# Patient Record
Sex: Female | Born: 1935 | Race: Black or African American | Hispanic: No | Smoking: Never smoker
Health system: Southern US, Community
[De-identification: ages and names within clinical notes are randomized; demographics above are authoritative.]

## PROBLEM LIST (undated history)

## (undated) DIAGNOSIS — I10 Essential (primary) hypertension: Secondary | ICD-10-CM

## (undated) DIAGNOSIS — J302 Other seasonal allergic rhinitis: Secondary | ICD-10-CM

## (undated) DIAGNOSIS — E785 Hyperlipidemia, unspecified: Secondary | ICD-10-CM

## (undated) DIAGNOSIS — E538 Deficiency of other specified B group vitamins: Secondary | ICD-10-CM

## (undated) DIAGNOSIS — H409 Unspecified glaucoma: Secondary | ICD-10-CM

## (undated) DIAGNOSIS — Z950 Presence of cardiac pacemaker: Secondary | ICD-10-CM

## (undated) DIAGNOSIS — R42 Dizziness and giddiness: Secondary | ICD-10-CM

## (undated) DIAGNOSIS — E039 Hypothyroidism, unspecified: Secondary | ICD-10-CM

## (undated) DIAGNOSIS — G629 Polyneuropathy, unspecified: Secondary | ICD-10-CM

## (undated) DIAGNOSIS — N189 Chronic kidney disease, unspecified: Secondary | ICD-10-CM

## (undated) HISTORY — DX: Deficiency of other specified B group vitamins: E53.8

## (undated) HISTORY — PX: ABDOMINAL HYSTERECTOMY: SHX81

## (undated) HISTORY — DX: Essential (primary) hypertension: I10

## (undated) HISTORY — DX: Hyperlipidemia, unspecified: E78.5

## (undated) HISTORY — DX: Other seasonal allergic rhinitis: J30.2

## (undated) HISTORY — DX: Chronic kidney disease, unspecified: N18.9

## (undated) HISTORY — PX: DG GALL BLADDER: HXRAD326

## (undated) HISTORY — DX: Hypothyroidism, unspecified: E03.9

## (undated) HISTORY — DX: Polyneuropathy, unspecified: G62.9

---

## 2015-03-28 DIAGNOSIS — E538 Deficiency of other specified B group vitamins: Secondary | ICD-10-CM | POA: Insufficient documentation

## 2015-03-28 DIAGNOSIS — I495 Sick sinus syndrome: Secondary | ICD-10-CM | POA: Diagnosis present

## 2015-03-28 DIAGNOSIS — E039 Hypothyroidism, unspecified: Secondary | ICD-10-CM | POA: Diagnosis present

## 2015-03-28 DIAGNOSIS — K219 Gastro-esophageal reflux disease without esophagitis: Secondary | ICD-10-CM | POA: Diagnosis present

## 2015-03-28 DIAGNOSIS — G629 Polyneuropathy, unspecified: Secondary | ICD-10-CM

## 2015-03-28 DIAGNOSIS — Z950 Presence of cardiac pacemaker: Secondary | ICD-10-CM | POA: Diagnosis present

## 2015-05-23 DIAGNOSIS — D649 Anemia, unspecified: Secondary | ICD-10-CM | POA: Diagnosis present

## 2016-04-03 DIAGNOSIS — I10 Essential (primary) hypertension: Secondary | ICD-10-CM | POA: Diagnosis present

## 2016-10-22 DIAGNOSIS — E7800 Pure hypercholesterolemia, unspecified: Secondary | ICD-10-CM | POA: Diagnosis present

## 2017-07-23 DIAGNOSIS — D563 Thalassemia minor: Secondary | ICD-10-CM | POA: Insufficient documentation

## 2018-02-01 DIAGNOSIS — E559 Vitamin D deficiency, unspecified: Secondary | ICD-10-CM | POA: Insufficient documentation

## 2018-11-30 DIAGNOSIS — R928 Other abnormal and inconclusive findings on diagnostic imaging of breast: Secondary | ICD-10-CM | POA: Insufficient documentation

## 2019-08-11 DIAGNOSIS — N1832 Chronic kidney disease, stage 3b: Secondary | ICD-10-CM | POA: Insufficient documentation

## 2019-08-11 DIAGNOSIS — Z9181 History of falling: Secondary | ICD-10-CM | POA: Insufficient documentation

## 2020-04-30 ENCOUNTER — Other Ambulatory Visit: Payer: Self-pay

## 2020-04-30 ENCOUNTER — Encounter (HOSPITAL_COMMUNITY): Payer: Self-pay

## 2020-04-30 ENCOUNTER — Ambulatory Visit (HOSPITAL_COMMUNITY)
Admission: EM | Admit: 2020-04-30 | Discharge: 2020-04-30 | Disposition: A | Discharge status: Home / Self Care | Payer: Self-pay | Attending: Emergency Medicine | Admitting: Emergency Medicine

## 2020-04-30 DIAGNOSIS — R55 Syncope and collapse: Secondary | ICD-10-CM | POA: Insufficient documentation

## 2020-04-30 DIAGNOSIS — R42 Dizziness and giddiness: Secondary | ICD-10-CM | POA: Insufficient documentation

## 2020-04-30 HISTORY — DX: Presence of cardiac pacemaker: Z95.0

## 2020-04-30 HISTORY — DX: Unspecified glaucoma: H40.9

## 2020-04-30 HISTORY — DX: Dizziness and giddiness: R42

## 2020-04-30 LAB — CBC WITH DIFFERENTIAL/PLATELET
Abs Immature Granulocytes: 0.04 10*3/uL (ref 0.00–0.07)
Basophils Absolute: 0 10*3/uL (ref 0.0–0.1)
Basophils Relative: 1 %
Eosinophils Absolute: 0.2 10*3/uL (ref 0.0–0.5)
Eosinophils Relative: 3 %
HCT: 33.8 % — ABNORMAL LOW (ref 36.0–46.0)
Hemoglobin: 10.6 g/dL — ABNORMAL LOW (ref 12.0–15.0)
Immature Granulocytes: 1 %
Lymphocytes Relative: 21 %
Lymphs Abs: 1.3 10*3/uL (ref 0.7–4.0)
MCH: 22 pg — ABNORMAL LOW (ref 26.0–34.0)
MCHC: 31.4 g/dL (ref 30.0–36.0)
MCV: 70.1 fL — ABNORMAL LOW (ref 80.0–100.0)
Monocytes Absolute: 0.6 10*3/uL (ref 0.1–1.0)
Monocytes Relative: 10 %
Neutro Abs: 4.1 10*3/uL (ref 1.7–7.7)
Neutrophils Relative %: 64 %
Platelets: 167 10*3/uL (ref 150–400)
RBC: 4.82 MIL/uL (ref 3.87–5.11)
RDW: 16.2 % — ABNORMAL HIGH (ref 11.5–15.5)
WBC: 6.2 10*3/uL (ref 4.0–10.5)
nRBC: 0 % (ref 0.0–0.2)

## 2020-04-30 LAB — POCT URINALYSIS DIPSTICK, ED / UC
Bilirubin Urine: NEGATIVE
Glucose, UA: NEGATIVE mg/dL
Hgb urine dipstick: NEGATIVE
Ketones, ur: NEGATIVE mg/dL
Leukocytes,Ua: NEGATIVE
Nitrite: NEGATIVE
Protein, ur: NEGATIVE mg/dL
Specific Gravity, Urine: 1.01 (ref 1.005–1.030)
Urobilinogen, UA: 0.2 mg/dL (ref 0.0–1.0)
pH: 6 (ref 5.0–8.0)

## 2020-04-30 LAB — COMPREHENSIVE METABOLIC PANEL
ALT: 15 U/L (ref 0–44)
AST: 25 U/L (ref 15–41)
Albumin: 3.5 g/dL (ref 3.5–5.0)
Alkaline Phosphatase: 51 U/L (ref 38–126)
Anion gap: 10 (ref 5–15)
BUN: 19 mg/dL (ref 8–23)
CO2: 25 mmol/L (ref 22–32)
Calcium: 9.1 mg/dL (ref 8.9–10.3)
Chloride: 102 mmol/L (ref 98–111)
Creatinine, Ser: 1.48 mg/dL — ABNORMAL HIGH (ref 0.44–1.00)
GFR, Estimated: 34 mL/min — ABNORMAL LOW (ref >60)
Glucose, Bld: 104 mg/dL — ABNORMAL HIGH (ref 70–99)
Potassium: 4.3 mmol/L (ref 3.5–5.1)
Sodium: 137 mmol/L (ref 135–145)
Total Bilirubin: 0.5 mg/dL (ref 0.3–1.2)
Total Protein: 6.2 g/dL — ABNORMAL LOW (ref 6.5–8.1)

## 2020-04-30 LAB — CBG MONITORING, ED: Glucose-Capillary: 103 mg/dL — ABNORMAL HIGH (ref 70–99)

## 2020-04-30 NOTE — ED Notes (Signed)
Second cup of water given to patient

## 2020-04-30 NOTE — ED Triage Notes (Signed)
Pt in with c/o near syncope event that occurred this morning while she was standing.  Pt states she started to feel dizzy and almost fainted but her son caught her. Also states she was confused and "foggy headed" for about 2 minutes after the event  Pt states she recently changed BP medications

## 2020-04-30 NOTE — ED Provider Notes (Signed)
[facility]    CSN: [id number] Arrival date & time: 04/30/20  1219      History   Chief Complaint Chief Complaint  Patient presents with  . Near Syncope    HPI Kristine Sherman is a 87 y.o. female.   Kristine Sherman presents with complaints of episode of near syncope this morning, hours before arrival. She was standing preparing her oatmeal when she felt very dizzy and unsteady. Her son was near and held her and lowered her to the ground. He states she closed her eyes but was always able to verbalize responses, and she states she was aware the entire time what was going, denying that she ever completely lost consciousness. After briefly laying down to recover he aided her in standing back up to get to a chair. She ate and drank. She still felt dizzy so presented to urgent care. She feels dizzy when she is up but feels fair with sitting. States her vision was blurred but is improving. No headache. No chest pain. No weakness. Her son states that her speech sounded different with episode but now sounds normal. Yesterday (Easter), she slept most of the day, which is unusual for her, she slept through brunch even. Last week was treated with colace for constipation. Denies any diarrhea. No vomiting. States she has not been drinking adequate water intake and has noted concentrated urine. Her blood pressure medication was changed from amlodipine to coreg, two weeks ago. She has a pacemaker.    States she stakes vitamins (d, b and multi), gabapentin, aspirin, diltiazem and three eye drops. She is not on any blood thinner.   ROS per HPI, negative if not otherwise mentioned.      Past Medical History:  Diagnosis Date  . Glaucoma   . Pacemaker   . Vertigo     There are no problems to display for this patient.   History reviewed. No pertinent surgical history.  OB History   No obstetric history on file.      Home Medications    Prior to Admission medications   Not on File     Family History History reviewed. No pertinent family history.  Social History Social History   Tobacco Use  . Smoking status: Never Smoker  . Smokeless tobacco: Never Used  Substance Use Topics  . Drug use: Never     Allergies   Other and Sulfa antibiotics   Review of Systems Review of Systems   Physical Exam Triage Vital Signs ED Triage Vitals  Enc Vitals Group     BP 04/30/20 1225 130/78     Pulse Rate 04/30/20 1225 67     Resp 04/30/20 1225 17     Temp --      Temp src --      SpO2 04/30/20 1225 98 %     Weight --      Height --      Head Circumference --      Peak Flow --      Pain Score 04/30/20 1228 0     Pain Loc --      Pain Edu? --      Excl. in GC? --    Orthostatic VS for the past 24 hrs:  BP- Lying Pulse- Lying BP- Sitting Pulse- Sitting BP- Standing at 0 minutes Pulse- Standing at 0 minutes  04/30/20 1332 137/86 75 138/87 71 (!) 124/92 86    Updated Vital Signs BP 123/85   Pulse 82  Resp 16   SpO2 98%   Visual Acuity Right Eye Distance:   Left Eye Distance:   Bilateral Distance:    Right Eye Near:   Left Eye Near:    Bilateral Near:     Physical Exam Constitutional:      General: She is not in acute distress.    Appearance: She is well-developed. She is not ill-appearing.  HENT:     Head: Normocephalic and atraumatic.     Mouth/Throat:     Mouth: Mucous membranes are moist.  Eyes:     Extraocular Movements: Extraocular movements intact.     Conjunctiva/sclera: Conjunctivae normal.     Pupils: Pupils are equal, round, and reactive to light.  Cardiovascular:     Rate and Rhythm: Normal rate and regular rhythm.  Pulmonary:     Effort: Pulmonary effort is normal.     Breath sounds: Normal breath sounds.  Musculoskeletal:        General: Normal range of motion.     Comments: Ambulatory in hallway and to restroom without difficulty   Skin:    General: Skin is warm and dry.  Neurological:     General: No focal deficit  present.     Mental Status: She is alert and oriented to person, place, and time.     Cranial Nerves: No cranial nerve deficit.     Sensory: No sensory deficit.     Motor: No weakness.     Coordination: Coordination normal.     Gait: Gait normal.  Psychiatric:        Mood and Affect: Mood normal.    ekg- paced rhythm without any obvious acute changes. No previous ekgs to compare.   UC Treatments / Results  Labs (all labs ordered are listed, but only abnormal results are displayed) Labs Reviewed  CBC WITH DIFFERENTIAL/PLATELET - Abnormal; Notable for the following components:      Result Value   Hemoglobin 10.6 (*)    HCT 33.8 (*)    MCV 70.1 (*)    MCH 22.0 (*)    RDW 16.2 (*)    All other components within normal limits  COMPREHENSIVE METABOLIC PANEL - Abnormal; Notable for the following components:   Glucose, Bld 104 (*)    Creatinine, Ser 1.48 (*)    Total Protein 6.2 (*)    GFR, Estimated 34 (*)    All other components within normal limits  CBG MONITORING, ED - Abnormal; Notable for the following components:   Glucose-Capillary 103 (*)    All other components within normal limits  POCT URINALYSIS DIPSTICK, ED / UC    EKG   Radiology No results found.  Procedures Procedures (including critical care time)  Medications Ordered in UC Medications - No data to display  Initial Impression / Assessment and Plan / UC Course  I have reviewed the triage vital signs and the nursing notes.  Pertinent labs & imaging results that were available during my care of the patient were reviewed by me and considered in my medical decision making (see chart for details).     1446- patient and son at bedside endorse continuing to generally feel improved; ambulated to restroom unassisted and feels fine. Drank 2 cups of water, she feels this has helped   Patient is alert, no weakness, confusion, slurred speech, chest pain, shortness of breath, persistent dizziness, urinary  symptoms, gi symptoms or uri symptoms. No headache. ekg without acute findings. Anemia- she states she has thalassemia and  feels this is likely normal for her. Creatinine 1.48- she has been told she has "borderline" kidney disease, suspect this is near baseline as well. Recently changed BP medication, now on diltiazem. Noted mild orthostatic hypostension here in clinic, suspect this may have contributed to episode this morning. She feels improved having increased her fluid intake while here. Cva/tia as well as acs considered and discussed with patient and her son. She will be staying with her son, they are agreeable to strict at home ER precautions. Agreeable to establishing with a PCP locally for chronic disease and medication management. Patient and son verbalized understanding and agreeable to plan.  Ambulatory out of clinic without difficulty.    Final Clinical Impressions(s) / UC Diagnoses   Final diagnoses:  Near syncope  Dizziness     Discharge Instructions     Your ekg is overall unremarkable which is reassuring.  Your vital signs looks fair, your blood pressure did drop some when you stood up, which can certainly cause dizziness, and can be related to dehydration.  Your urine looks well.  You are mildly anemic. I am not able to say if this is new for you or not, if it is new it certainly can cause some dizziness.  Increase your water intake.  If symptoms persist without any improvement please go to the ER. If nay worsening please go to the ER.  Please establish with a primary care provider for recheck of your symptoms in the next two weeks.     ED Prescriptions    None     PDMP not reviewed this encounter.   Georgetta Haber, NP 04/30/20 1606

## 2020-04-30 NOTE — ED Notes (Signed)
Pt is unable to urinate x 2, provider notified

## 2020-04-30 NOTE — Discharge Instructions (Signed)
Your ekg is overall unremarkable which is reassuring.  Your vital signs looks fair, your blood pressure did drop some when you stood up, which can certainly cause dizziness, and can be related to dehydration.  Your urine looks well.  You are mildly anemic. I am not able to say if this is new for you or not, if it is new it certainly can cause some dizziness.  Increase your water intake.  If symptoms persist without any improvement please go to the ER. If nay worsening please go to the ER.  Please establish with a primary care provider for recheck of your symptoms in the next two weeks.

## 2020-05-07 ENCOUNTER — Encounter: Payer: Self-pay | Admitting: *Deleted

## 2020-08-31 DIAGNOSIS — Z961 Presence of intraocular lens: Secondary | ICD-10-CM | POA: Insufficient documentation

## 2020-08-31 DIAGNOSIS — H401133 Primary open-angle glaucoma, bilateral, severe stage: Secondary | ICD-10-CM | POA: Diagnosis present

## 2021-01-08 ENCOUNTER — Telehealth: Payer: Self-pay

## 2021-01-08 NOTE — Telephone Encounter (Signed)
NOTES SCANNED TO REFERRAL 

## 2021-01-21 ENCOUNTER — Other Ambulatory Visit: Payer: Self-pay | Admitting: Family Medicine

## 2021-01-21 DIAGNOSIS — R0989 Other specified symptoms and signs involving the circulatory and respiratory systems: Secondary | ICD-10-CM

## 2021-02-01 ENCOUNTER — Ambulatory Visit
Admission: RE | Admit: 2021-02-01 | Discharge: 2021-02-01 | Disposition: A | Discharge status: Home / Self Care | Payer: Medicare PPO | Source: Ambulatory Visit | Attending: Family Medicine | Admitting: Family Medicine

## 2021-02-01 DIAGNOSIS — R0989 Other specified symptoms and signs involving the circulatory and respiratory systems: Secondary | ICD-10-CM

## 2021-02-19 ENCOUNTER — Other Ambulatory Visit: Payer: Self-pay

## 2021-02-19 ENCOUNTER — Ambulatory Visit: Payer: Medicare PPO | Admitting: Cardiology

## 2021-02-19 ENCOUNTER — Telehealth: Payer: Self-pay

## 2021-02-19 VITALS — BP 128/68 | HR 80 | Ht 65.0 in | Wt 129.4 lb

## 2021-02-19 DIAGNOSIS — Z950 Presence of cardiac pacemaker: Secondary | ICD-10-CM | POA: Diagnosis not present

## 2021-02-19 DIAGNOSIS — R001 Bradycardia, unspecified: Secondary | ICD-10-CM | POA: Diagnosis not present

## 2021-02-19 NOTE — Telephone Encounter (Signed)
Ordered patient remote monitor to ship to house and per [facility] from [facility], she will have patient released from clinic in [place] to our clinic.   Routing to CMA pool to follow up in a few weeks on monitor.

## 2021-02-19 NOTE — Progress Notes (Signed)
Electrophysiology Office Note:    Date:  02/19/2021   ID:  Kristine Sherman, DOB April 10, 1928, MRN [id number]  PCP:  Sharmon Revere, MD  [facility] Cardiologist:  None  [facility] Electrophysiologist:  None   Referring MD: Sharmon Revere, MD   Chief Complaint: Pacemaker in situ  History of Present Illness:    Kristine Sherman is a 88 y.o. female who presents to establish care for [employer] PPM implanted 10/16/2014 at the request of Dr. Delia Chimes. Their medical history includes hypertension, hyperlipidemia, hypothyroidism, CKD, neuropathy, and glaucoma.  01/08/2021 Referral notes from Dr. Delia Chimes reviewed.  Today, she appears well overall. She confirms her pacemaker was implanted due to a bradycardic rhythm that caused her to have a syncopal episode while at church.  She denies any palpitations, chest pain, or shortness of breath. No lightheadedness, headaches, orthopnea, PND, lower extremity edema or exertional symptoms.      Past Medical History:  Diagnosis Date   CKD (chronic kidney disease)    Glaucoma    Hyperlipidemia    Hypertension    Hypothyroidism    Neuropathy    Pacemaker    Seasonal allergies    Vertigo    Vitamin B12 deficiency     Past Surgical History:  Procedure Laterality Date   ABDOMINAL HYSTERECTOMY     DG GALL BLADDER      Current Medications: Current Meds  Medication Sig   acetaminophen (TYLENOL) 325 MG tablet Take 650 mg by mouth every 6 (six) hours as needed.   aspirin EC 81 MG tablet Take 81 mg by mouth daily. Swallow whole.   b complex vitamins capsule Take 1 capsule by mouth daily.   brimonidine-timolol (COMBIGAN) 0.2-0.5 % ophthalmic solution Place 1 drop into both eyes every 12 (twelve) hours.   Cholecalciferol (VITAMIN D3) 50 MCG (2000 UT) TABS Take by mouth.   Choline Fenofibrate (FENOFIBRIC ACID PO) Take by mouth.   diltiazem (DILACOR XR) 120 MG 24 hr capsule Take 120 mg by mouth daily.   docusate sodium (COLACE) 100 MG capsule  Take 100 mg by mouth 2 (two) times daily.   dorzolamide (TRUSOPT) 2 % ophthalmic solution 1 drop 3 (three) times daily.   Fenofibric Acid 105 MG TABS Take by mouth.   folic acid (FOLVITE) 800 MCG tablet Take 800 mcg by mouth daily.   gabapentin (NEURONTIN) 100 MG capsule Take 100 mg by mouth 3 (three) times daily.   levothyroxine (SYNTHROID) 50 MCG tablet Take 50 mcg by mouth daily before breakfast.   Magnesium 250 MG TABS Take by mouth.   meclizine (ANTIVERT) 12.5 MG tablet Take 12.5 mg by mouth 3 (three) times daily as needed for dizziness.   Multiple Vitamins-Minerals (PRESERVISION AREDS PO) Take by mouth.   Netarsudil-Latanoprost (ROCKLATAN) 0.02-0.005 % SOLN Apply to eye.   Propylene Glycol (SYSTANE COMPLETE OP) Apply to eye.     Allergies:   Other and Sulfa antibiotics   Social History   Socioeconomic History   Marital status: Single    Spouse name: Not on file   Number of children: Not on file   Years of education: Not on file   Highest education level: Not on file  Occupational History   Not on file  Tobacco Use   Smoking status: Never   Smokeless tobacco: Never  Substance and Sexual Activity   Alcohol use: Yes   Drug use: Never   Sexual activity: Not on file  Other Topics Concern   Not on file  Social History  Narrative   Not on file   Social Determinants of Health   Financial Resource Strain: Not on file  Food Insecurity: Not on file  Transportation Needs: Not on file  Physical Activity: Not on file  Stress: Not on file  Social Connections: Not on file     Family History: The patient's family history is not on file.  ROS:   Please see the history of present illness.    All other systems reviewed and are negative.  EKGs/Labs/Other Studies Reviewed:    The following studies were reviewed today:  Bilateral LE Dopplers 02/01/2021: FINDINGS: Right Lower Extremity   ABI: 1.2   Inflow: Normal common femoral arterial waveforms and velocities.  No evidence of inflow (aortoiliac) disease.   Outflow: Normal profunda femoral, superficial femoral and popliteal arterial waveforms and velocities. No focal elevation of the PSV to suggest stenosis.   Runoff: Normal posterior and anterior tibial arterial waveforms and velocities. Vessels are patent to the ankle.   Left Lower Extremity   ABI: 1.2   Inflow: Normal common femoral arterial waveforms and velocities. No evidence of inflow (aortoiliac) disease.   Outflow: Normal profunda femoral, superficial femoral and popliteal arterial waveforms and velocities. No focal elevation of the PSV to suggest stenosis.   Runoff: Normal posterior and anterior tibial arterial waveforms and velocities. Vessels are patent to the ankle.   IMPRESSION: Mild scattered atherosclerotic plaque without evidence of hemodynamically significant stenosis or occlusion in either lower extremity.   Bilateral resting ankle-brachial indices are within normal limits.  EKG:   02/19/2021: Atrial pacing, ventricular sensing   February 19, 2021 in clinic device interrogation personally reviewed Battery longevity 4 years 4 months Lead parameters stable 97% atrial pacing, 0% ventricular pacing   Recent Labs: 04/30/2020: ALT 15; BUN 19; Creatinine, Ser 1.48; Hemoglobin 10.6; Platelets 167; Potassium 4.3; Sodium 137  Recent Lipid Panel No results found for: CHOL, TRIG, HDL, CHOLHDL, VLDL, LDLCALC, LDLDIRECT  Physical Exam:    VS:  BP 128/68    Pulse 80    Ht 5\' 5"  (1.651 m)    Wt 129 lb 6.4 oz (58.7 kg)    SpO2 99%    BMI 21.53 kg/m     Wt Readings from Last 3 Encounters:  02/19/21 129 lb 6.4 oz (58.7 kg)     GEN: Well nourished, well developed in no acute distress.  Appears younger than stated age HEENT: Normal NECK: No JVD; No carotid bruits LYMPHATICS: No lymphadenopathy CARDIAC: RRR, no murmurs, rubs, gallops. PPM incision well healed. RESPIRATORY:  Clear to auscultation without rales, wheezing or  rhonchi  ABDOMEN: Soft, non-tender, non-distended MUSCULOSKELETAL:  No edema; No deformity  SKIN: Warm and dry NEUROLOGIC:  Alert and oriented x 3 PSYCHIATRIC:  Normal affect       ASSESSMENT:    1. Bradycardia   2. Pacemaker    PLAN:    In order of problems listed above:  #Symptomatic bradycardia #Pacemaker in situ Device functioning appropriately.  We will order her a remote monitor.  We will enroll her in remote monitoring with our clinic.  We will plan to see her back in 1 year or sooner as needed.  I will get a chest x-ray to document lead position given this is her first time in our practice.  Follow-up in 1 year with APP.  Medication Adjustments/Labs and Tests Ordered: Current medicines are reviewed at length with the patient today.  Concerns regarding medicines are outlined above.   Orders Placed This  Encounter  Procedures   DG Chest 2 View   EKG 12-Lead   No orders of the defined types were placed in this encounter.  I,Mathew Stumpf,acting as a Neurosurgeon for Lanier Prude, MD.,have documented all relevant documentation on the behalf of Lanier Prude, MD,as directed by  Lanier Prude, MD while in the presence of Lanier Prude, MD.  I, Lanier Prude, MD, have reviewed all documentation for this visit. The documentation on 02/19/21 for the exam, diagnosis, procedures, and orders are all accurate and complete.   Signed, Rossie Muskrat. Lalla Brothers, MD, [facility], [facility] 02/19/2021 2:47 PM    Electrophysiology [facility]

## 2021-02-19 NOTE — Patient Instructions (Signed)

## 2021-02-27 NOTE — Telephone Encounter (Signed)
Pt received her new monitor. She may need to come into the office to get programmed to do remote monitoring.

## 2021-03-01 ENCOUNTER — Telehealth: Payer: Self-pay

## 2021-03-01 NOTE — Telephone Encounter (Signed)
I spoke with [employer] and they states the patient has not transmitted since Nov 2022. Since it has been over 60 days the patient needs to be reset with the programmer. The patient Will need to bring her home remote monitor at the schedule in office visit.  I will send to [facility] to schedule an appointment with device clinic or app.   I left a message on the patient voicemail to give [place] a call back.

## 2021-03-20 ENCOUNTER — Ambulatory Visit (INDEPENDENT_AMBULATORY_CARE_PROVIDER_SITE_OTHER): Payer: Medicare PPO

## 2021-03-20 ENCOUNTER — Other Ambulatory Visit: Payer: Self-pay

## 2021-03-20 DIAGNOSIS — R001 Bradycardia, unspecified: Secondary | ICD-10-CM

## 2021-03-20 DIAGNOSIS — Z950 Presence of cardiac pacemaker: Secondary | ICD-10-CM

## 2021-03-20 LAB — CUP PACEART INCLINIC DEVICE CHECK
Battery Remaining Longevity: 52 mo
Brady Statistic RA Percent Paced: 97 %
Brady Statistic RV Percent Paced: 0 %
Date Time Interrogation Session: 20230308191058
Implantable Lead Implant Date: 20161003
Implantable Lead Implant Date: 20161003
Implantable Lead Location: 753859
Implantable Lead Location: 753860
Implantable Lead Model: 377
Implantable Lead Model: 377
Implantable Lead Serial Number: 49295355
Implantable Lead Serial Number: 49335002
Implantable Pulse Generator Implant Date: 20161003
Lead Channel Impedance Value: 507 Ohm
Lead Channel Impedance Value: 526 Ohm
Lead Channel Pacing Threshold Amplitude: 0.6 V
Lead Channel Pacing Threshold Amplitude: 0.8 V
Lead Channel Pacing Threshold Pulse Width: 0.4 ms
Lead Channel Pacing Threshold Pulse Width: 0.4 ms
Lead Channel Sensing Intrinsic Amplitude: 4.3 mV
Lead Channel Sensing Intrinsic Amplitude: 7.3 mV
Lead Channel Setting Pacing Amplitude: 2.4 V
Lead Channel Setting Pacing Amplitude: 2.4 V
Lead Channel Setting Pacing Pulse Width: 0.4 ms
Pulse Gen Model: 394929
Pulse Gen Serial Number: 68595075

## 2021-03-20 NOTE — Progress Notes (Signed)
Pacemaker check in clinic to reset home monitoring. Normal device function. Thresholds, sensing, impedances consistent with previous measurements. Device programmed to maximize longevity. No mode switch or high ventricular rates noted. Device programmed at appropriate safety margins. Histogram distribution appropriate for patient activity level. Device programmed to optimize intrinsic conduction. Estimated longevity 4 years 4 months. Patient enrolled in remote follow-up. Confirmed ability to transmit via test transmission.  Patient education completed. ?

## 2021-03-23 ENCOUNTER — Other Ambulatory Visit: Payer: Self-pay

## 2021-03-23 ENCOUNTER — Observation Stay (HOSPITAL_COMMUNITY)
Admission: EM | Admit: 2021-03-23 | Discharge: 2021-03-24 | Disposition: A | Discharge status: Home / Self Care | Payer: Medicare PPO | Attending: Internal Medicine | Admitting: Internal Medicine

## 2021-03-23 ENCOUNTER — Emergency Department (HOSPITAL_COMMUNITY): Payer: Medicare PPO

## 2021-03-23 ENCOUNTER — Encounter (HOSPITAL_COMMUNITY): Payer: Self-pay

## 2021-03-23 DIAGNOSIS — R531 Weakness: Secondary | ICD-10-CM

## 2021-03-23 DIAGNOSIS — Z79899 Other long term (current) drug therapy: Secondary | ICD-10-CM | POA: Insufficient documentation

## 2021-03-23 DIAGNOSIS — H401133 Primary open-angle glaucoma, bilateral, severe stage: Secondary | ICD-10-CM

## 2021-03-23 DIAGNOSIS — Z950 Presence of cardiac pacemaker: Secondary | ICD-10-CM | POA: Diagnosis not present

## 2021-03-23 DIAGNOSIS — I495 Sick sinus syndrome: Secondary | ICD-10-CM

## 2021-03-23 DIAGNOSIS — E871 Hypo-osmolality and hyponatremia: Secondary | ICD-10-CM | POA: Insufficient documentation

## 2021-03-23 DIAGNOSIS — R42 Dizziness and giddiness: Secondary | ICD-10-CM

## 2021-03-23 DIAGNOSIS — D649 Anemia, unspecified: Secondary | ICD-10-CM | POA: Diagnosis not present

## 2021-03-23 DIAGNOSIS — N179 Acute kidney failure, unspecified: Secondary | ICD-10-CM | POA: Diagnosis not present

## 2021-03-23 DIAGNOSIS — Z20822 Contact with and (suspected) exposure to covid-19: Secondary | ICD-10-CM | POA: Insufficient documentation

## 2021-03-23 DIAGNOSIS — Z7982 Long term (current) use of aspirin: Secondary | ICD-10-CM | POA: Insufficient documentation

## 2021-03-23 DIAGNOSIS — G629 Polyneuropathy, unspecified: Secondary | ICD-10-CM

## 2021-03-23 DIAGNOSIS — N183 Chronic kidney disease, stage 3 unspecified: Secondary | ICD-10-CM

## 2021-03-23 DIAGNOSIS — E039 Hypothyroidism, unspecified: Secondary | ICD-10-CM | POA: Diagnosis not present

## 2021-03-23 DIAGNOSIS — E860 Dehydration: Secondary | ICD-10-CM | POA: Insufficient documentation

## 2021-03-23 DIAGNOSIS — E876 Hypokalemia: Secondary | ICD-10-CM | POA: Diagnosis not present

## 2021-03-23 DIAGNOSIS — N1832 Chronic kidney disease, stage 3b: Secondary | ICD-10-CM | POA: Insufficient documentation

## 2021-03-23 DIAGNOSIS — I10 Essential (primary) hypertension: Secondary | ICD-10-CM | POA: Diagnosis not present

## 2021-03-23 DIAGNOSIS — I129 Hypertensive chronic kidney disease with stage 1 through stage 4 chronic kidney disease, or unspecified chronic kidney disease: Secondary | ICD-10-CM | POA: Insufficient documentation

## 2021-03-23 DIAGNOSIS — E7800 Pure hypercholesterolemia, unspecified: Secondary | ICD-10-CM

## 2021-03-23 DIAGNOSIS — D509 Iron deficiency anemia, unspecified: Secondary | ICD-10-CM | POA: Insufficient documentation

## 2021-03-23 DIAGNOSIS — K219 Gastro-esophageal reflux disease without esophagitis: Secondary | ICD-10-CM

## 2021-03-23 LAB — CBC WITH DIFFERENTIAL/PLATELET
Abs Immature Granulocytes: 0.06 10*3/uL (ref 0.00–0.07)
Basophils Absolute: 0 10*3/uL (ref 0.0–0.1)
Basophils Relative: 0 %
Eosinophils Absolute: 0.1 10*3/uL (ref 0.0–0.5)
Eosinophils Relative: 1 %
HCT: 34 % — ABNORMAL LOW (ref 36.0–46.0)
Hemoglobin: 10.6 g/dL — ABNORMAL LOW (ref 12.0–15.0)
Immature Granulocytes: 1 %
Lymphocytes Relative: 17 %
Lymphs Abs: 1.6 10*3/uL (ref 0.7–4.0)
MCH: 22.3 pg — ABNORMAL LOW (ref 26.0–34.0)
MCHC: 31.2 g/dL (ref 30.0–36.0)
MCV: 71.4 fL — ABNORMAL LOW (ref 80.0–100.0)
Monocytes Absolute: 0.7 10*3/uL (ref 0.1–1.0)
Monocytes Relative: 7 %
Neutro Abs: 6.9 10*3/uL (ref 1.7–7.7)
Neutrophils Relative %: 74 %
Platelets: 153 10*3/uL (ref 150–400)
RBC: 4.76 MIL/uL (ref 3.87–5.11)
RDW: 15.5 % (ref 11.5–15.5)
WBC: 9.3 10*3/uL (ref 4.0–10.5)
nRBC: 0 % (ref 0.0–0.2)

## 2021-03-23 LAB — COMPREHENSIVE METABOLIC PANEL
ALT: 14 U/L (ref 0–44)
AST: 18 U/L (ref 15–41)
Albumin: 3.3 g/dL — ABNORMAL LOW (ref 3.5–5.0)
Alkaline Phosphatase: 70 U/L (ref 38–126)
Anion gap: 8 (ref 5–15)
BUN: 40 mg/dL — ABNORMAL HIGH (ref 8–23)
CO2: 24 mmol/L (ref 22–32)
Calcium: 8.5 mg/dL — ABNORMAL LOW (ref 8.9–10.3)
Chloride: 102 mmol/L (ref 98–111)
Creatinine, Ser: 1.72 mg/dL — ABNORMAL HIGH (ref 0.44–1.00)
GFR, Estimated: 28 mL/min — ABNORMAL LOW (ref >60)
Glucose, Bld: 103 mg/dL — ABNORMAL HIGH (ref 70–99)
Potassium: 3.3 mmol/L — ABNORMAL LOW (ref 3.5–5.1)
Sodium: 134 mmol/L — ABNORMAL LOW (ref 135–145)
Total Bilirubin: 0.4 mg/dL (ref 0.3–1.2)
Total Protein: 6.6 g/dL (ref 6.5–8.1)

## 2021-03-23 LAB — URINALYSIS, ROUTINE W REFLEX MICROSCOPIC
Bilirubin Urine: NEGATIVE
Glucose, UA: 50 mg/dL — AB
Ketones, ur: NEGATIVE mg/dL
Nitrite: NEGATIVE
Protein, ur: NEGATIVE mg/dL
Specific Gravity, Urine: 1.008 (ref 1.005–1.030)
pH: 5 (ref 5.0–8.0)

## 2021-03-23 LAB — PROTIME-INR
INR: 1.1 (ref 0.8–1.2)
Prothrombin Time: 13.7 seconds (ref 11.4–15.2)

## 2021-03-23 LAB — TROPONIN I (HIGH SENSITIVITY)
Troponin I (High Sensitivity): 6 ng/L (ref <18)
Troponin I (High Sensitivity): 6 ng/L (ref <18)

## 2021-03-23 LAB — MAGNESIUM: Magnesium: 2.2 mg/dL (ref 1.7–2.4)

## 2021-03-23 LAB — LACTIC ACID, PLASMA
Lactic Acid, Venous: 0.9 mmol/L (ref 0.5–1.9)
Lactic Acid, Venous: 1.2 mmol/L (ref 0.5–1.9)

## 2021-03-23 LAB — RESP PANEL BY RT-PCR (FLU A&B, COVID) ARPGX2
Influenza A by PCR: NEGATIVE
Influenza B by PCR: NEGATIVE
SARS Coronavirus 2 by RT PCR: NEGATIVE

## 2021-03-23 LAB — LIPASE, BLOOD: Lipase: 55 U/L — ABNORMAL HIGH (ref 11–51)

## 2021-03-23 MED ORDER — DORZOLAMIDE HCL-TIMOLOL MAL 2-0.5 % OP SOLN
1.0000 [drp] | Freq: Two times a day (BID) | OPHTHALMIC | Status: DC
  Filled 2021-03-23: qty 10

## 2021-03-23 MED ORDER — GABAPENTIN 100 MG PO CAPS
100.0000 mg | ORAL_CAPSULE | Freq: Three times a day (TID) | ORAL | Status: DC
  Administered 2021-03-23 – 2021-03-24 (×2): 100 mg via ORAL
  Filled 2021-03-23 (×2): qty 1

## 2021-03-23 MED ORDER — ONDANSETRON HCL 4 MG PO TABS: 4.0000 mg | ORAL_TABLET | Freq: Four times a day (QID) | ORAL | Status: DC | PRN

## 2021-03-23 MED ORDER — ONDANSETRON HCL 4 MG/2ML IJ SOLN: 4.0000 mg | Freq: Four times a day (QID) | INTRAMUSCULAR | Status: DC | PRN

## 2021-03-23 MED ORDER — ONDANSETRON HCL 4 MG/2ML IJ SOLN
4.0000 mg | Freq: Once | INTRAMUSCULAR | Status: AC
Start: 2021-03-23 — End: 2021-03-23
  Administered 2021-03-23: 4 mg via INTRAVENOUS
  Filled 2021-03-23: qty 2

## 2021-03-23 MED ORDER — SODIUM CHLORIDE 0.9% FLUSH
3.0000 mL | Freq: Two times a day (BID) | INTRAVENOUS | Status: DC
  Administered 2021-03-23 – 2021-03-24 (×2): 3 mL via INTRAVENOUS

## 2021-03-23 MED ORDER — DILTIAZEM HCL ER 120 MG PO CP24: 120.0000 mg | ORAL_CAPSULE | Freq: Every day | ORAL | Status: DC

## 2021-03-23 MED ORDER — POLYETHYLENE GLYCOL 3350 17 G PO PACK: 17.0000 g | PACK | Freq: Every day | ORAL | Status: DC | PRN

## 2021-03-23 MED ORDER — LACTATED RINGERS IV SOLN: INTRAVENOUS | Status: DC

## 2021-03-23 MED ORDER — NETARSUDIL-LATANOPROST 0.02-0.005 % OP SOLN: Freq: Every day | OPHTHALMIC | Status: DC

## 2021-03-23 MED ORDER — MECLIZINE HCL 25 MG PO TABS: 12.5000 mg | ORAL_TABLET | Freq: Three times a day (TID) | ORAL | Status: DC | PRN

## 2021-03-23 MED ORDER — ASPIRIN EC 81 MG PO TBEC
81.0000 mg | DELAYED_RELEASE_TABLET | Freq: Every day | ORAL | Status: DC
  Administered 2021-03-24: 81 mg via ORAL
  Filled 2021-03-23: qty 1

## 2021-03-23 MED ORDER — ACETAMINOPHEN 325 MG PO TABS: 650.0000 mg | ORAL_TABLET | Freq: Four times a day (QID) | ORAL | Status: DC | PRN

## 2021-03-23 MED ORDER — ACETAMINOPHEN 650 MG RE SUPP: 650.0000 mg | Freq: Four times a day (QID) | RECTAL | Status: DC | PRN

## 2021-03-23 MED ORDER — BRIMONIDINE TARTRATE 0.2 % OP SOLN
1.0000 [drp] | Freq: Three times a day (TID) | OPHTHALMIC | Status: DC
  Administered 2021-03-24: 1 [drp] via OPHTHALMIC
  Filled 2021-03-23: qty 5

## 2021-03-23 MED ORDER — LACTATED RINGERS IV BOLUS
1000.0000 mL | Freq: Once | INTRAVENOUS | Status: AC
  Administered 2021-03-23: 1000 mL via INTRAVENOUS

## 2021-03-23 MED ORDER — POTASSIUM CHLORIDE 20 MEQ PO PACK
40.0000 meq | PACK | Freq: Once | ORAL | Status: AC
  Administered 2021-03-23: 40 meq via ORAL
  Filled 2021-03-23: qty 2

## 2021-03-23 MED ORDER — DILTIAZEM HCL ER COATED BEADS 120 MG PO CP24
120.0000 mg | ORAL_CAPSULE | Freq: Every day | ORAL | Status: DC
  Administered 2021-03-24: 120 mg via ORAL
  Filled 2021-03-23: qty 1

## 2021-03-23 MED ORDER — PANTOPRAZOLE SODIUM 40 MG PO TBEC
40.0000 mg | DELAYED_RELEASE_TABLET | Freq: Every day | ORAL | Status: DC
  Administered 2021-03-24: 40 mg via ORAL
  Filled 2021-03-23: qty 1

## 2021-03-23 MED ORDER — ENOXAPARIN SODIUM 30 MG/0.3ML IJ SOSY
30.0000 mg | PREFILLED_SYRINGE | INTRAMUSCULAR | Status: DC
  Administered 2021-03-24: 30 mg via SUBCUTANEOUS
  Filled 2021-03-23: qty 0.3

## 2021-03-23 MED ORDER — LEVOTHYROXINE SODIUM 50 MCG PO TABS
50.0000 ug | ORAL_TABLET | Freq: Every day | ORAL | Status: DC
  Administered 2021-03-24: 50 ug via ORAL
  Filled 2021-03-23: qty 1

## 2021-03-23 NOTE — ED Provider Notes (Signed)
[facility]-EMERGENCY DEPT Provider Note   CSN: [id number] Arrival date & time: 03/23/21  1603     History  Chief Complaint  Patient presents with   Weakness    Kristine Sherman is a 88 y.o. female.  HPI At baseline patient is a active 88 year old.  She walks around the block near her home.  She became ill 1 week ago.  She developed vertigo on Saturday.  Patient reports she has a prior history of vertigo.  She was experiencing symptoms Saturday and Sunday.  She was trying meclizine.  She was getting some improvement in her dizziness,  however she continued to be nauseated and weak and has had difficulty eating or drinking over the course of the week.  Her son has been assisting her at home to get up to the bathroom for the past couple days due to significant weakness.  Today, when she tried to get up she ended up collapsing down on the floor due to general weakness.  She did not have any injury or fall associated.  It was a slow lowering to the floor. denies she has developed any vomiting.  She reports she did get a small amount of diarrhea over the past day or 2.  She is making urine and denies pain or burning.  Denies shortness of breath chest pain cough or abdominal pain.  Patient reports that she has however gotten quite weak over the past several days.  Patient's son reports now he is having to assist her to get up and walk to the bathroom.  She has not had a syncopal episode.  Patient had a 500 cc normal saline bolus on route from medics.    Home Medications Prior to Admission medications   Medication Sig Start Date End Date Taking? Authorizing Provider  acetaminophen (TYLENOL) 325 MG tablet Take 650 mg by mouth every 6 (six) hours as needed.    [provider]  aspirin EC 81 MG tablet Take 81 mg by mouth daily. Swallow whole.    [provider]  b complex vitamins capsule Take 1 capsule by mouth daily.    [provider]   brimonidine-timolol (COMBIGAN) 0.2-0.5 % ophthalmic solution Place 1 drop into both eyes every 12 (twelve) hours.    [provider]  Cholecalciferol (VITAMIN D3) 50 MCG (2000 UT) TABS Take by mouth.    [provider]  diltiazem (DILACOR XR) 120 MG 24 hr capsule Take 120 mg by mouth daily.    [provider]  docusate sodium (COLACE) 100 MG capsule Take 100 mg by mouth 2 (two) times daily.    [provider]  dorzolamide (TRUSOPT) 2 % ophthalmic solution 1 drop 3 (three) times daily.    [provider]  dorzolamide-timolol (COSOPT) 22.3-6.8 MG/ML ophthalmic solution  03/11/21   [provider]  folic acid (FOLVITE) 800 MCG tablet Take 800 mcg by mouth daily.    [provider]  gabapentin (NEURONTIN) 100 MG capsule Take 100 mg by mouth 3 (three) times daily.    [provider]  levothyroxine (SYNTHROID) 50 MCG tablet Take 50 mcg by mouth daily before breakfast.    [provider]  losartan-hydrochlorothiazide (HYZAAR) 50-12.5 MG tablet Take by mouth. 03/14/21   [provider]  Magnesium 250 MG TABS Take by mouth.    [provider]  meclizine (ANTIVERT) 12.5 MG tablet Take 12.5 mg by mouth 3 (three) times daily as needed for dizziness.    [provider]  Multiple Vitamins-Minerals (PRESERVISION AREDS PO) Take by mouth.    [provider]  Netarsudil-Latanoprost (ROCKLATAN) 0.02-0.005 % SOLN Apply to eye.    [provider]  Propylene Glycol (SYSTANE COMPLETE OP) Apply to eye.    [provider]      Allergies    Other and Sulfa antibiotics    Review of Systems   Review of Systems 10 Systems reviewed negative except as per HPI Physical Exam Updated Vital Signs BP 132/76   Pulse 67   Temp (!) 97.4 F (36.3 C) (Oral)   Resp 14   SpO2 100%  Physical Exam Constitutional:      Comments: Patient is alert.  Clear mental status.  No respiratory distress.   She is pale and fatigued in appearance.  Her physical conditioning is very good for age.  HENT:     Mouth/Throat:     Mouth: Mucous membranes are dry.     Pharynx: Oropharynx is clear.  Eyes:     Extraocular Movements: Extraocular movements intact.  Cardiovascular:     Rate and Rhythm: Normal rate and regular rhythm.  Pulmonary:     Effort: Pulmonary effort is normal.     Breath sounds: Normal breath sounds.  Abdominal:     General: There is no distension.     Palpations: Abdomen is soft.     Tenderness: There is no abdominal tenderness. There is no guarding.  Musculoskeletal:        General: No swelling, tenderness or deformity. Normal range of motion.     Right lower leg: No edema.     Left lower leg: No edema.  Skin:    General: Skin is warm and dry.     Coloration: Skin is pale.  Neurological:     General: No focal deficit present.     Mental Status: She is oriented to person, place, and time.     Motor: No weakness.     Coordination: Coordination normal.  Psychiatric:        Mood and Affect: Mood normal.    ED Results / Procedures / Treatments   Labs (all labs ordered are listed, but only abnormal results are displayed) Labs Reviewed  COMPREHENSIVE METABOLIC PANEL - Abnormal; Notable for the following components:      Result Value   Sodium 134 (*)    Potassium 3.3 (*)    Glucose, Bld 103 (*)    BUN 40 (*)    Creatinine, Ser 1.72 (*)    Calcium 8.5 (*)    Albumin 3.3 (*)    GFR, Estimated 28 (*)    All other components within normal limits  LIPASE, BLOOD - Abnormal; Notable for the following components:   Lipase 55 (*)    All other components within normal limits  CBC WITH DIFFERENTIAL/PLATELET - Abnormal; Notable for the following components:   Hemoglobin 10.6 (*)    HCT 34.0 (*)    MCV 71.4 (*)    MCH 22.3 (*)    All other components within normal limits  RESP PANEL BY RT-PCR (FLU A&B, COVID) ARPGX2  LACTIC ACID, PLASMA  LACTIC ACID, PLASMA   PROTIME-INR  URINALYSIS, ROUTINE W REFLEX MICROSCOPIC  MAGNESIUM  BASIC METABOLIC PANEL  CBC  TROPONIN I (HIGH SENSITIVITY)  TROPONIN I (HIGH SENSITIVITY)    EKG EKG Interpretation  Date/Time:  Saturday March 23 2021 16:26:41 EST Ventricular Rate:  62 PR Interval:  265 QRS Duration: 91 QT Interval:  420  QTC Calculation: 427 R Axis:   85 Text Interpretation: Sinus rhythm Prolonged PR interval Borderline right axis deviation Low voltage, precordial leads no sig change from previous Confirmed by Arby Barrette [phone]) on 03/23/2021 5:37:58 PM  Radiology CT Head Wo Contrast  Result Date: 03/23/2021 CLINICAL DATA:  Dizziness, persistent/recurrent, cardiac or vascular cause suspected EXAM: CT HEAD WITHOUT CONTRAST TECHNIQUE: Contiguous axial images were obtained from the base of the skull through the vertex without intravenous contrast. RADIATION DOSE REDUCTION: This exam was performed according to the departmental dose-optimization program which includes automated exposure control, adjustment of the mA and/or kV according to patient size and/or use of iterative reconstruction technique. COMPARISON:  None. BRAIN: BRAIN Patchy and confluent areas of decreased attenuation are noted throughout the deep and periventricular Zacher matter of the cerebral hemispheres bilaterally, compatible with chronic microvascular ischemic disease. No evidence of large-territorial acute infarction. No parenchymal hemorrhage. No mass lesion. No extra-axial collection. No mass effect or midline shift. No hydrocephalus. Basilar cisterns are patent. Vascular: No hyperdense vessel. Atherosclerotic calcifications are present within the cavernous internal carotid arteries. Skull: No acute fracture or focal lesion. Sinuses/Orbits: Paranasal sinuses and mastoid air cells are clear. Query bilateral lens replacement. Otherwise the orbits are unremarkable. Other: None. IMPRESSION: No acute intracranial abnormality. Electronically  Signed   By: Tish Frederickson M.D.   On: 03/23/2021 19:51   DG Chest Port 1 View  Result Date: 03/23/2021 CLINICAL DATA:  Weakness. EXAM: PORTABLE CHEST 1 VIEW COMPARISON:  None FINDINGS: The cardiomediastinal silhouette is unremarkable. A LEFT-sided pacemaker is noted. There is no evidence of focal airspace disease, pulmonary edema, suspicious pulmonary nodule/mass, pleural effusion, or pneumothorax. No acute bony abnormalities are identified. IMPRESSION: No active disease. Electronically Signed   By: Harmon Pier M.D.   On: 03/23/2021 17:19    Procedures Procedures    Medications Ordered in ED Medications  lactated ringers infusion ( Intravenous New Bag/Given 03/23/21 2008)  aspirin EC tablet 81 mg (has no administration in time range)  levothyroxine (SYNTHROID) tablet 50 mcg (has no administration in time range)  meclizine (ANTIVERT) tablet 12.5 mg (has no administration in time range)  gabapentin (NEURONTIN) capsule 100 mg (has no administration in time range)  enoxaparin (LOVENOX) injection 30 mg (has no administration in time range)  sodium chloride flush (NS) 0.9 % injection 3 mL (has no administration in time range)  acetaminophen (TYLENOL) tablet 650 mg (has no administration in time range)    Or  acetaminophen (TYLENOL) suppository 650 mg (has no administration in time range)  polyethylene glycol (MIRALAX / GLYCOLAX) packet 17 g (has no administration in time range)  ondansetron (ZOFRAN) tablet 4 mg (has no administration in time range)    Or  ondansetron (ZOFRAN) injection 4 mg (has no administration in time range)  lactated ringers bolus 1,000 mL (0 mLs Intravenous Stopped 03/23/21 2023)  ondansetron (ZOFRAN) injection 4 mg (4 mg Intravenous Given 03/23/21 2023)    ED Course/ Medical Decision Making/ A&P                           Medical Decision Making Amount and/or Complexity of Data Reviewed Labs: ordered. Radiology: ordered.  Risk Prescription drug  management. Decision regarding hospitalization.   Presents with 7 days of symptoms.  Initial onset was with vertiginous symptoms.  Vertiginous symptoms passed but nausea persisted.  Patient's had poor oral intake.  We will proceed with broad diagnostic evaluation for generalized weakness in  elderly patient.  Will include CT head, lab work, troponin and EKG.  Patient's blood pressures are slightly soft at onset.  Will initiate rehydration with lactated Ringer's and Zofran for nausea.  Lab results and EKG reviewed and interpreted by myself.  EKG shows no acute changes and troponin is normal.  At this time no evidence of ACS.  Metabolic panel does suggest AKI with dehydration.  We will continue fluid rehydration.  CT head and chest x-ray do not show acute findings.  At this time no sign of infectious etiology.  Symptoms appear primarily due to dehydration precipitated by vertigo.  Patient is neurologic exam is normal without any focal deficits and she is not having ongoing vertigo.  At this time she does not appear to require urgent or emergent MRI to further evaluate pre-existing diagnosis of vertigo.  We will plan for observation\admission for continued hydration and symptom control and further diagnostic evaluation if needed.  Consult: Dr. Alinda Money to admit for [facility].        Final Clinical Impression(s) / ED Diagnoses Final diagnoses:  Dehydration  AKI (acute kidney injury) [facility])  Generalized weakness    Rx / DC Orders ED Discharge Orders     None         Arby Barrette, MD 03/23/21 2112

## 2021-03-23 NOTE — ED Notes (Signed)
Patient transported to CT 

## 2021-03-23 NOTE — ED Triage Notes (Signed)
Patient brought in via ems from home. Patient lives at home with son. Patient states she has been feeling progressively weaker since Monday. States had one episode of N/D ?

## 2021-03-23 NOTE — H&P (Signed)
History and Physical   Kristine Sherman [id number] DOB: [birth year] DOA: 03/23/2021  PCP: Loura Pardon, MD   Patient coming from: Home  Chief Complaint: Weakness  HPI: Kristine Sherman is a 88 y.o. female with medical history significant of CKD, hyperlipidemia, hypertension, hypothyroidism, neuropathy, sick sinus syndrome status post pacemaker, anemia, GERD, open-angle glaucoma presenting with ongoing weakness.  Patient's symptoms initially started around a week ago and consisted of vertigo on Saturday and Sunday.  She has a history of this and tried meclizine with some improvement in her vertigo symptoms.  She did continue to have some associated nausea leading to decreased p.o. intake.  She subsequently had some increasing weakness.  She tried to get up today and her weakness and got to the point where she had to slowly lower herself to the ground as she was unable to walk unassisted.  She is active at baseline and is able to walk around her neighborhood.  No syncopal events reported per patient.  Received 500 cc of IV fluids in route by EMS.  She denies fevers, chills, chest pain, shortness of breath, abdominal pain, constipation, diarrhea.  ED Course: Vital signs in the ED were stable.  Lab work-up included CMP which showed sodium 134, potassium 3.3, creatinine elevated to 1.72 from baseline of 1.1, calcium 8.5, albumin 3.3.  CBC showed hemoglobin stable at 10.6.  PT and INR within normal limits.  Lactic acid normal x2.  Troponin normal x2.  Lipase mildly elevated to 55.  Respiratory panel for flu and COVID-negative.  Urinalysis pending.  CT of the head without acute abnormality.  Chest x-ray without acute abnormality.  Patient received Zofran and a liter of fluids in the ED also started on a rate of fluids.  Review of Systems: As per HPI otherwise all other systems reviewed and are negative.  Past Medical History:  Diagnosis Date   CKD (chronic kidney disease)    Glaucoma     Hyperlipidemia    Hypertension    Hypothyroidism    Neuropathy    Pacemaker    Seasonal allergies    Vertigo    Vitamin B12 deficiency     Past Surgical History:  Procedure Laterality Date   ABDOMINAL HYSTERECTOMY     DG GALL BLADDER      Social History  reports that she has never smoked. She has never used smokeless tobacco. She reports current alcohol use. She reports that she does not use drugs.  Allergies  Allergen Reactions   Other     Percodan- caused seizures   Oxycodone-Aspirin     Other reaction(s): neurological reaction, Other (See Comments) Passed out     Sulfa Antibiotics Hives, Itching and Rash    Other reaction(s): rash/itching    Family History  Problem Relation Age of Onset   Rheum arthritis Mother    Breast cancer Sister   Reviewed on admission  Prior to Admission medications   Medication Sig Start Date End Date Taking? Authorizing Provider  acetaminophen (TYLENOL) 325 MG tablet Take 650 mg by mouth every 6 (six) hours as needed.    [provider]  aspirin EC 81 MG tablet Take 81 mg by mouth daily. Swallow whole.    [provider]  b complex vitamins capsule Take 1 capsule by mouth daily.    [provider]  brimonidine-timolol (COMBIGAN) 0.2-0.5 % ophthalmic solution Place 1 drop into both eyes every 12 (twelve) hours.    [provider]  Cholecalciferol (VITAMIN D3) 50 MCG (  2000 UT) TABS Take by mouth.    [provider]  diltiazem (DILACOR XR) 120 MG 24 hr capsule Take 120 mg by mouth daily.    [provider]  docusate sodium (COLACE) 100 MG capsule Take 100 mg by mouth 2 (two) times daily.    [provider]  dorzolamide (TRUSOPT) 2 % ophthalmic solution 1 drop 3 (three) times daily.    [provider]  dorzolamide-timolol (COSOPT) 22.3-6.8 MG/ML ophthalmic solution  03/11/21   [provider]  folic acid (FOLVITE) Q000111Q MCG tablet Take 800 mcg by mouth daily.     [provider]  gabapentin (NEURONTIN) 100 MG capsule Take 100 mg by mouth 3 (three) times daily.    [provider]  levothyroxine (SYNTHROID) 50 MCG tablet Take 50 mcg by mouth daily before breakfast.    [provider]  losartan-hydrochlorothiazide (HYZAAR) 50-12.5 MG tablet Take by mouth. 03/14/21   [provider]  Magnesium 250 MG TABS Take by mouth.    [provider]  meclizine (ANTIVERT) 12.5 MG tablet Take 12.5 mg by mouth 3 (three) times daily as needed for dizziness.    [provider]  Multiple Vitamins-Minerals (PRESERVISION AREDS PO) Take by mouth.    [provider]  Netarsudil-Latanoprost (ROCKLATAN) 0.02-0.005 % SOLN Apply to eye.    [provider]  Propylene Glycol (SYSTANE COMPLETE OP) Apply to eye.    [provider]    Physical Exam: Vitals:   03/23/21 1930 03/23/21 2000 03/23/21 2030 03/23/21 2100  BP: 132/75 131/73 139/70 132/76  Pulse: 62 63 64 67  Resp: 12 15 14 14   Temp:      TempSrc:      SpO2: 100% 100% 100% 100%    Physical Exam Constitutional:      General: She is not in acute distress.    Appearance: Normal appearance.     Comments: Thin elderly female  HENT:     Head: Normocephalic and atraumatic.     Mouth/Throat:     Mouth: Mucous membranes are moist.     Pharynx: Oropharynx is clear.  Eyes:     Extraocular Movements: Extraocular movements intact.     Pupils: Pupils are equal, round, and reactive to light.  Cardiovascular:     Rate and Rhythm: Normal rate and regular rhythm.     Pulses: Normal pulses.     Heart sounds: Normal heart sounds.  Pulmonary:     Effort: Pulmonary effort is normal. No respiratory distress.     Breath sounds: Normal breath sounds.  Abdominal:     General: Bowel sounds are normal. There is no distension.     Palpations: Abdomen is soft.     Tenderness: There is no abdominal tenderness.  Musculoskeletal:        General: No swelling  or deformity.  Skin:    General: Skin is warm and dry.  Neurological:     General: No focal deficit present.     Mental Status: Mental status is at baseline.     Motor: Weakness (Generalized) present.   Labs on Admission: I have personally reviewed following labs and imaging studies  CBC: Recent Labs  Lab 03/23/21 1701  WBC 9.3  NEUTROABS 6.9  HGB 10.6*  HCT 34.0*  MCV 71.4*  PLT 0000000    Basic Metabolic Panel: Recent Labs  Lab 03/23/21 1701  NA 134*  K 3.3*  CL 102  CO2 24  GLUCOSE 103*  BUN 40*  CREATININE 1.72*  CALCIUM 8.5*    GFR: CrCl cannot be calculated (Unknown ideal weight.).  Liver Function Tests: Recent Labs  Lab 03/23/21 1701  AST 18  ALT 14  ALKPHOS 70  BILITOT 0.4  PROT 6.6  ALBUMIN 3.3*    Urine analysis:    Component Value Date/Time   LABSPEC 1.010 04/30/2020 1524   PHURINE 6.0 04/30/2020 1524   GLUCOSEU NEGATIVE 04/30/2020 1524   HGBUR NEGATIVE 04/30/2020 1524   [place] 04/30/2020 1524   [place] 04/30/2020 1524   PROTEINUR NEGATIVE 04/30/2020 1524   UROBILINOGEN 0.2 04/30/2020 1524   NITRITE NEGATIVE 04/30/2020 1524   LEUKOCYTESUR NEGATIVE 04/30/2020 1524    Radiological Exams on Admission: CT Head Wo Contrast  Result Date: 03/23/2021 CLINICAL DATA:  Dizziness, persistent/recurrent, cardiac or vascular cause suspected EXAM: CT HEAD WITHOUT CONTRAST TECHNIQUE: Contiguous axial images were obtained from the base of the skull through the vertex without intravenous contrast. RADIATION DOSE REDUCTION: This exam was performed according to the departmental dose-optimization program which includes automated exposure control, adjustment of the mA and/or kV according to patient size and/or use of iterative reconstruction technique. COMPARISON:  None. BRAIN: BRAIN Patchy and confluent areas of decreased attenuation are noted throughout the deep and periventricular Kopp matter of the cerebral hemispheres bilaterally,  compatible with chronic microvascular ischemic disease. No evidence of large-territorial acute infarction. No parenchymal hemorrhage. No mass lesion. No extra-axial collection. No mass effect or midline shift. No hydrocephalus. Basilar cisterns are patent. Vascular: No hyperdense vessel. Atherosclerotic calcifications are present within the cavernous internal carotid arteries. Skull: No acute fracture or focal lesion. Sinuses/Orbits: Paranasal sinuses and mastoid air cells are clear. Query bilateral lens replacement. Otherwise the orbits are unremarkable. Other: None. IMPRESSION: No acute intracranial abnormality. Electronically Signed   By: Iven Finn M.D.   On: 03/23/2021 19:51   DG Chest Port 1 View  Result Date: 03/23/2021 CLINICAL DATA:  Weakness. EXAM: PORTABLE CHEST 1 VIEW COMPARISON:  None FINDINGS: The cardiomediastinal silhouette is unremarkable. A LEFT-sided pacemaker is noted. There is no evidence of focal airspace disease, pulmonary edema, suspicious pulmonary nodule/mass, pleural effusion, or pneumothorax. No acute bony abnormalities are identified. IMPRESSION: No active disease. Electronically Signed   By: Margarette Canada M.D.   On: 03/23/2021 17:19    EKG: Independently reviewed.  Paced rhythm at 62 bpm.  QTc 427.  Low voltage in several leads.  Assessment/Plan Principal Problem:   Acute renal failure superimposed on stage 3 chronic kidney disease (HCC) Active Problems:   Acquired hypothyroidism   Chronic anemia   Essential hypertension   Gastroesophageal reflux disease without esophagitis   Hypercholesteremia   Neuropathy   Pacemaker   Primary open angle glaucoma (POAG) of both eyes, severe stage   SSS (sick sinus syndrome) (HCC)   Vertigo   AKI on CKD 3 Weakness Hypokalemia > Patient presenting with creatinine elevated to 1.72 from baseline of around 1.1.  In the setting of ongoing nausea believed to be secondary to vertigo as below.  Dehydration has led to progressive  weakness. > Potassium 3.3 in the ED. > Received a liter of fluids in the ED and started on a rate of 125 an hour - Continue with IV fluids overnight - Replete with 40 mEq p.o. potassium - Check magnesium - Trend renal function and electrolytes - PT and OT eval and treat given weakness  Vertigo > Experiencing symptoms Saturday and Sunday of last week which improved some with meclizine but had continued  nausea related to dehydration and weakness as above. - Continue with meclizine - As needed Zofran for nausea - PT and OT eval and treat as above  Hypertension - Continue home diltiazem  Hyperlipidemia - Not currently taking any medications for this  Hypothyroidism - Continue home Synthroid  Neuropathy - Continue home gabapentin  Sick sinus syndrome status post pacemaker - Noted  Anemia > Hemoglobin stable at 10.6 in the ED - Continue monitor CBC  GERD - Continue home PPI  Open-angle glaucoma - Continue home eyedrops   DVT prophylaxis: Lovenox Code Status:   DNR Family Communication:  Family updated at bedside, son. Disposition Plan:   Patient is from:  Home  Anticipated DC to:  Home  Anticipated DC date:  1 to 2 days  Anticipated DC barriers: None  Consults called:  None Admission status:  Observation, MedSurg  Severity of Illness: The appropriate patient status for this patient is OBSERVATION. Observation status is judged to be reasonable and necessary in order to provide the required intensity of service to ensure the patient's safety. The patient's presenting symptoms, physical exam findings, and initial radiographic and laboratory data in the context of their medical condition is felt to place them at decreased risk for further clinical deterioration. Furthermore, it is anticipated that the patient will be medically stable for discharge from the hospital within 2 midnights of admission.    Marcelyn Bruins MD [facility]  How to contact the [facility]  Attending or Consulting provider [place] or covering provider during after hours [place], for this patient?   Check the care team in [facility] and look for a) attending/consulting [facility] provider listed and b) the [facility] team listed Log into [email or web address] and use 's universal password to access. If you do not have the password, please contact the hospital operator. Locate the [facility] provider you are looking for under [facility] and page to a number that you can be directly reached. If you still have difficulty reaching the provider, please page the [facility] (Director on Call) for the Hospitalists listed on amion for assistance.  03/23/2021, 9:14 PM

## 2021-03-24 DIAGNOSIS — I10 Essential (primary) hypertension: Secondary | ICD-10-CM | POA: Diagnosis not present

## 2021-03-24 DIAGNOSIS — H401133 Primary open-angle glaucoma, bilateral, severe stage: Secondary | ICD-10-CM | POA: Diagnosis not present

## 2021-03-24 DIAGNOSIS — Z950 Presence of cardiac pacemaker: Secondary | ICD-10-CM | POA: Diagnosis not present

## 2021-03-24 DIAGNOSIS — N179 Acute kidney failure, unspecified: Secondary | ICD-10-CM | POA: Diagnosis not present

## 2021-03-24 LAB — BASIC METABOLIC PANEL
Anion gap: 9 (ref 5–15)
BUN: 27 mg/dL — ABNORMAL HIGH (ref 8–23)
CO2: 22 mmol/L (ref 22–32)
Calcium: 8.6 mg/dL — ABNORMAL LOW (ref 8.9–10.3)
Chloride: 105 mmol/L (ref 98–111)
Creatinine, Ser: 1.21 mg/dL — ABNORMAL HIGH (ref 0.44–1.00)
GFR, Estimated: 43 mL/min — ABNORMAL LOW (ref >60)
Glucose, Bld: 79 mg/dL (ref 70–99)
Potassium: 3.7 mmol/L (ref 3.5–5.1)
Sodium: 136 mmol/L (ref 135–145)

## 2021-03-24 LAB — CBC
HCT: 32 % — ABNORMAL LOW (ref 36.0–46.0)
Hemoglobin: 9.8 g/dL — ABNORMAL LOW (ref 12.0–15.0)
MCH: 21.9 pg — ABNORMAL LOW (ref 26.0–34.0)
MCHC: 30.6 g/dL (ref 30.0–36.0)
MCV: 71.4 fL — ABNORMAL LOW (ref 80.0–100.0)
Platelets: 150 10*3/uL (ref 150–400)
RBC: 4.48 MIL/uL (ref 3.87–5.11)
RDW: 15.6 % — ABNORMAL HIGH (ref 11.5–15.5)
WBC: 8.3 10*3/uL (ref 4.0–10.5)
nRBC: 0 % (ref 0.0–0.2)

## 2021-03-24 NOTE — Progress Notes (Signed)
Discharge instructions explained to Kristine Sherman and her son. Next medication doses that are due are written on the discharge medication sheet. Patient and son deny having any questions. [name] was discharged via wheelchair. And going home with son. ?

## 2021-03-24 NOTE — Discharge Summary (Signed)
Physician Discharge Summary  Kristine Sherman [id number] DOB: 17-Dec-1928 DOA: 03/23/2021  PCP: Loura Pardon, MD  Admit date: 03/23/2021 Discharge date: 03/24/2021  Admitted From: Home Disposition: Home  Recommendations for Outpatient Follow-up:  Follow up with PCP in 1 week with repeat CBC/BMP Outpatient follow-up with cardiology Follow up in ED if symptoms worsen or new appear   Home Health: No Equipment/Devices: None  Discharge Condition: Stable CODE STATUS: DNR  diet recommendation: Heart healthy  Brief/Interim Summary:   Discharge Diagnoses:  Acute kidney injury on chronic kidney disease stage IIIb Generalized weakness/dehydration -Baseline creatinine of around 1.1.  Presented with creatinine of 1.72.  Treated with IV fluids.  Creatinine much improved to 1.21. -Losartan/hydrochlorothiazide to remain on hold on discharge. -Encourage oral intake. -Symptoms have improved.   -Outpatient follow-up with PCP with repeat BMP within a week -Discharge home today after PT evaluation  Vertigo/dizziness -Possibly from above.  Continue as needed meclizine.  Outpatient follow-up. -Recommend follow-up with cardiology/EP  Hypertension -Blood pressure currently stable and currently not on antihypertensives.  Hold antihypertensives till reevaluation by PCP.  Hypokalemia -Improved  Hyponatremia -Mild.  Improved  Microcytic anemia -Questionable cause.  Hemoglobin stable.  Outpatient follow-up.  Hypothyroidism -Continue home Synthroid  Neuropathy -Continue home gabapentin  Sick sinus syndrome status post pacemaker -Outpatient follow-up with EP  GERD -Continue PPI  Open angle glaucoma -Continue home regimen.  Outpatient follow-up   Discharge Instructions   Allergies as of 03/24/2021       Reactions   Oxycodone-aspirin Other (See Comments)   Neurologic reaction - caused seizures, passed out (reaction to Percodan)   Sulfa Antibiotics Hives, Itching, Rash         Medication List     STOP taking these medications    diltiazem 120 MG 24 hr capsule Commonly known as: DILACOR XR   losartan-hydrochlorothiazide 50-12.5 MG tablet Commonly known as: HYZAAR       TAKE these medications    acetaminophen 325 MG tablet Commonly known as: TYLENOL Take 650 mg by mouth every 6 (six) hours as needed for headache.   aspirin EC 81 MG tablet Take 81 mg by mouth at bedtime. Swallow whole.   atorvastatin 40 MG tablet Commonly known as: LIPITOR Take 40 mg by mouth every morning.   brimonidine 0.2 % ophthalmic solution Commonly known as: ALPHAGAN Place 1 drop into both eyes 3 (three) times daily.   dorzolamide-timolol 22.3-6.8 MG/ML ophthalmic solution Commonly known as: COSOPT Place 1 drop into both eyes 2 (two) times daily.   gabapentin 100 MG capsule Commonly known as: NEURONTIN Take 200 mg by mouth at bedtime.   levothyroxine 50 MCG tablet Commonly known as: SYNTHROID Take 50 mcg by mouth daily before breakfast.   meclizine 12.5 MG tablet Commonly known as: ANTIVERT Take 12.5 mg by mouth 3 (three) times daily as needed for dizziness.   Rocklatan 0.02-0.005 % Soln Generic drug: Netarsudil-Latanoprost Place 1 drop into both eyes at bedtime.   SYSTANE PRESERVATIVE FREE OP Place 1 drop into both eyes 4 (four) times daily as needed (dry eyes). Systane Complete Preservative Free   VITAMIN B-12 SL Place 500 mcg under the tongue every morning. 1 spray - 500 mcg         Allergies  Allergen Reactions   Oxycodone-Aspirin Other (See Comments)    Neurologic reaction - caused seizures, passed out (reaction to Percodan)     Sulfa Antibiotics Hives, Itching and Rash    Consultations: None   Procedures/Studies: CT Head Wo  Contrast  Result Date: 03/23/2021 CLINICAL DATA:  Dizziness, persistent/recurrent, cardiac or vascular cause suspected EXAM: CT HEAD WITHOUT CONTRAST TECHNIQUE: Contiguous axial images were obtained from the  base of the skull through the vertex without intravenous contrast. RADIATION DOSE REDUCTION: This exam was performed according to the departmental dose-optimization program which includes automated exposure control, adjustment of the mA and/or kV according to patient size and/or use of iterative reconstruction technique. COMPARISON:  None. BRAIN: BRAIN Patchy and confluent areas of decreased attenuation are noted throughout the deep and periventricular [place] matter of the cerebral hemispheres bilaterally, compatible with chronic microvascular ischemic disease. No evidence of large-territorial acute infarction. No parenchymal hemorrhage. No mass lesion. No extra-axial collection. No mass effect or midline shift. No hydrocephalus. Basilar cisterns are patent. Vascular: No hyperdense vessel. Atherosclerotic calcifications are present within the cavernous internal carotid arteries. Skull: No acute fracture or focal lesion. Sinuses/Orbits: Paranasal sinuses and mastoid air cells are clear. Query bilateral lens replacement. Otherwise the orbits are unremarkable. Other: None. IMPRESSION: No acute intracranial abnormality. Electronically Signed   By: Iven Finn M.D.   On: 03/23/2021 19:51   DG Chest Port 1 View  Result Date: 03/23/2021 CLINICAL DATA:  Weakness. EXAM: PORTABLE CHEST 1 VIEW COMPARISON:  None FINDINGS: The cardiomediastinal silhouette is unremarkable. A LEFT-sided pacemaker is noted. There is no evidence of focal airspace disease, pulmonary edema, suspicious pulmonary nodule/mass, pleural effusion, or pneumothorax. No acute bony abnormalities are identified. IMPRESSION: No active disease. Electronically Signed   By: Margarette Canada M.D.   On: 03/23/2021 17:19   CUP PACEART INCLINIC DEVICE CHECK  Result Date: 03/20/2021 Pacemaker check in clinic to reset home monitoring. Normal device function. Thresholds, sensing, impedances consistent with previous measurements. Device programmed to maximize  longevity. No mode switch or high ventricular rates noted. Device programmed at appropriate safety margins. Histogram distribution appropriate for patient activity level. Device programmed to optimize intrinsic conduction. Estimated longevity 4 years 4 months. Patient enrolled in remote follow-up. Confirmed ability to transmit via test transmission.  Patient education completed.     Subjective: Patient seen and examined at bedside.  Feels better.  No overnight fever or vomiting reported.  Feels okay to go home today.  Discharge Exam: Vitals:   03/24/21 0305 03/24/21 0629  BP: (!) 155/100 130/76  Pulse: 70 73  Resp: 14 14  Temp: (!) 97.3 F (36.3 C) 97.8 F (36.6 C)  SpO2: 99% 98%    General: Pt is alert, awake, not in acute distress.  Elderly female lying in bed. Cardiovascular: rate controlled, S1/S2 + Respiratory: bilateral decreased breath sounds at bases Abdominal: Soft, NT, ND, bowel sounds + Extremities: no edema, no cyanosis    The results of significant diagnostics from this hospitalization (including imaging, microbiology, ancillary and laboratory) are listed below for reference.     Microbiology: Recent Results (from the past 240 hour(s))  Resp Panel by RT-PCR (Flu A&B, Covid) Nasopharyngeal Swab     Status: None   Collection Time: 03/23/21  5:20 PM   Specimen: Nasopharyngeal Swab; Nasopharyngeal(NP) swabs in vial transport medium  Result Value Ref Range Status   SARS Coronavirus 2 by RT PCR NEGATIVE NEGATIVE Final    Comment: (NOTE) SARS-CoV-2 target nucleic acids are NOT DETECTED.  The SARS-CoV-2 RNA is generally detectable in upper respiratory specimens during the acute phase of infection. The lowest concentration of SARS-CoV-2 viral copies this assay can detect is 138 copies/mL. A negative result does not preclude SARS-Cov-2 infection and should not be  used as the sole basis for treatment or other patient management decisions. A negative result may occur  with  improper specimen collection/handling, submission of specimen other than nasopharyngeal swab, presence of viral mutation(s) within the areas targeted by this assay, and inadequate number of viral copies(<138 copies/mL). A negative result must be combined with clinical observations, patient history, and epidemiological information. The expected result is Negative.  Fact Sheet for Patients:  EntrepreneurPulse.com.au  Fact Sheet for Healthcare Providers:  IncredibleEmployment.be  This test is no t yet approved or cleared by the [place] FDA and  has been authorized for detection and/or diagnosis of SARS-CoV-2 by FDA under an Emergency Use Authorization (EUA). This EUA will remain  in effect (meaning this test can be used) for the duration of the COVID-19 declaration under Section 564(b)(1) of the Act, 21 U.S.C.section 360bbb-3(b)(1), unless the authorization is terminated  or revoked sooner.       Influenza A by PCR NEGATIVE NEGATIVE Final   Influenza B by PCR NEGATIVE NEGATIVE Final    Comment: (NOTE) The Xpert Xpress SARS-CoV-2/FLU/RSV plus assay is intended as an aid in the diagnosis of influenza from Nasopharyngeal swab specimens and should not be used as a sole basis for treatment. Nasal washings and aspirates are unacceptable for Xpert Xpress SARS-CoV-2/FLU/RSV testing.  Fact Sheet for Patients: EntrepreneurPulse.com.au  Fact Sheet for Healthcare Providers: IncredibleEmployment.be  This test is not yet approved or cleared by the [place] FDA and has been authorized for detection and/or diagnosis of SARS-CoV-2 by FDA under an Emergency Use Authorization (EUA). This EUA will remain in effect (meaning this test can be used) for the duration of the COVID-19 declaration under Section 564(b)(1) of the Act, 21 U.S.C. section 360bbb-3(b)(1), unless the authorization is terminated  or revoked.  Performed at [facility], [place] [address] [place], [place] [address]      Labs: BNP (last 3 results) No results for input(s): BNP in the last 8760 hours. Basic Metabolic Panel: Recent Labs  Lab 03/23/21 1701 03/23/21 2239 03/24/21 0613  NA 134*  --  136  K 3.3*  --  3.7  CL 102  --  105  CO2 24  --  22  GLUCOSE 103*  --  79  BUN 40*  --  27*  CREATININE 1.72*  --  1.21*  CALCIUM 8.5*  --  8.6*  MG  --  2.2  --    Liver Function Tests: Recent Labs  Lab 03/23/21 1701  AST 18  ALT 14  ALKPHOS 70  BILITOT 0.4  PROT 6.6  ALBUMIN 3.3*   Recent Labs  Lab 03/23/21 1701  LIPASE 55*   No results for input(s): AMMONIA in the last 168 hours. CBC: Recent Labs  Lab 03/23/21 1701 03/24/21 0757  WBC 9.3 8.3  NEUTROABS 6.9  --   HGB 10.6* 9.8*  HCT 34.0* 32.0*  MCV 71.4* 71.4*  PLT 153 150   Cardiac Enzymes: No results for input(s): CKTOTAL, CKMB, CKMBINDEX, TROPONINI in the last 168 hours. BNP: Invalid input(s): POCBNP CBG: No results for input(s): GLUCAP in the last 168 hours. D-Dimer No results for input(s): DDIMER in the last 72 hours. Hgb A1c No results for input(s): HGBA1C in the last 72 hours. Lipid Profile No results for input(s): CHOL, HDL, LDLCALC, TRIG, CHOLHDL, LDLDIRECT in the last 72 hours. Thyroid function studies No results for input(s): TSH, T4TOTAL, T3FREE, THYROIDAB in the last 72 hours.  Invalid input(s): FREET3 Anemia work up No  results for input(s): VITAMINB12, FOLATE, FERRITIN, TIBC, IRON, RETICCTPCT in the last 72 hours. Urinalysis    Component Value Date/Time   COLORURINE STRAW (A) 03/23/2021 2250   APPEARANCEUR CLEAR 03/23/2021 2250   LABSPEC 1.008 03/23/2021 2250   PHURINE 5.0 03/23/2021 2250   GLUCOSEU 50 (A) 03/23/2021 2250   HGBUR SMALL (A) 03/23/2021 2250   BILIRUBINUR NEGATIVE 03/23/2021 2250   KETONESUR NEGATIVE 03/23/2021 2250   PROTEINUR NEGATIVE 03/23/2021 2250   UROBILINOGEN 0.2  04/30/2020 1524   NITRITE NEGATIVE 03/23/2021 2250   LEUKOCYTESUR LARGE (A) 03/23/2021 2250   Sepsis Labs Invalid input(s): PROCALCITONIN,  WBC,  LACTICIDVEN Microbiology Recent Results (from the past 240 hour(s))  Resp Panel by RT-PCR (Flu A&B, Covid) Nasopharyngeal Swab     Status: None   Collection Time: 03/23/21  5:20 PM   Specimen: Nasopharyngeal Swab; Nasopharyngeal(NP) swabs in vial transport medium  Result Value Ref Range Status   SARS Coronavirus 2 by RT PCR NEGATIVE NEGATIVE Final    Comment: (NOTE) SARS-CoV-2 target nucleic acids are NOT DETECTED.  The SARS-CoV-2 RNA is generally detectable in upper respiratory specimens during the acute phase of infection. The lowest concentration of SARS-CoV-2 viral copies this assay can detect is 138 copies/mL. A negative result does not preclude SARS-Cov-2 infection and should not be used as the sole basis for treatment or other patient management decisions. A negative result may occur with  improper specimen collection/handling, submission of specimen other than nasopharyngeal swab, presence of viral mutation(s) within the areas targeted by this assay, and inadequate number of viral copies(<138 copies/mL). A negative result must be combined with clinical observations, patient history, and epidemiological information. The expected result is Negative.  Fact Sheet for Patients:  EntrepreneurPulse.com.au  Fact Sheet for Healthcare Providers:  IncredibleEmployment.be  This test is no t yet approved or cleared by the [place] FDA and  has been authorized for detection and/or diagnosis of SARS-CoV-2 by FDA under an Emergency Use Authorization (EUA). This EUA will remain  in effect (meaning this test can be used) for the duration of the COVID-19 declaration under Section 564(b)(1) of the Act, 21 U.S.C.section 360bbb-3(b)(1), unless the authorization is terminated  or revoked sooner.        Influenza A by PCR NEGATIVE NEGATIVE Final   Influenza B by PCR NEGATIVE NEGATIVE Final    Comment: (NOTE) The Xpert Xpress SARS-CoV-2/FLU/RSV plus assay is intended as an aid in the diagnosis of influenza from Nasopharyngeal swab specimens and should not be used as a sole basis for treatment. Nasal washings and aspirates are unacceptable for Xpert Xpress SARS-CoV-2/FLU/RSV testing.  Fact Sheet for Patients: EntrepreneurPulse.com.au  Fact Sheet for Healthcare Providers: IncredibleEmployment.be  This test is not yet approved or cleared by the [place] FDA and has been authorized for detection and/or diagnosis of SARS-CoV-2 by FDA under an Emergency Use Authorization (EUA). This EUA will remain in effect (meaning this test can be used) for the duration of the COVID-19 declaration under Section 564(b)(1) of the Act, 21 U.S.C. section 360bbb-3(b)(1), unless the authorization is terminated or revoked.  Performed at [facility], [place] [address], [place], [place] [address]      Time coordinating discharge: 35 minutes  SIGNED:   Aline August, MD  [facility] 03/24/2021, 10:09 AM

## 2021-03-24 NOTE — Evaluation (Addendum)
Physical Therapy Evaluation ?Patient Details ?Name: Kristine Sherman ?MRN: [id number] ?DOB: March 03, 1928 ?Today's Date: 03/24/2021 ? ?History of Present Illness ? Pt admitted from home with c/o weakness/dizziness and dx with acute renal failure and hypokalemia.  Pt with hx of CKD, vertigo, SSS with pacemaker, chronic anemis and glaucoma  ?Clinical Impression ? Pt admitted as above and presenting with functional mobility limitations 2* mild ambulatory balance deficits.  Pt up to ambulate 400' in halls this am and most stable with use of SBQC (uses at home as needed).  Pt should progress to dc home with assist of family. Pt declines HHPT at this time, states feels so much better since admit and expects to progressively get better at home. Pt reports no dizziness throughout session. ?   ? ?Recommendations for follow up therapy are one component of a multi-disciplinary discharge planning process, led by the attending physician.  Recommendations may be updated based on patient status, additional functional criteria and insurance authorization. ? ?Follow Up Recommendations No PT follow up ? ?  ?Assistance Recommended at Discharge Intermittent Supervision/Assistance  ?Patient can return home with the following ? A little help with walking and/or transfers;Assist for transportation;Help with stairs or ramp for entrance;Assistance with cooking/housework ? ?  ?Equipment Recommendations None recommended by PT  ?Recommendations for Other Services ?    ?  ?Functional Status Assessment Patient has had a recent decline in their functional status and demonstrates the ability to make significant improvements in function in a reasonable and predictable amount of time.  ? ?  ?Precautions / Restrictions Precautions ?Precautions: Fall  ? ?  ? ?Mobility ? Bed Mobility ?  ?  ?  ?  ?  ?  ?  ?General bed mobility comments: Pt up in chair but reports no difficulty exiting bed this am ?  ? ?Transfers ?Overall transfer level: Needs  assistance ?Equipment used: None ?Transfers: Sit to/from Stand ?Sit to Stand: Supervision ?  ?  ?  ?  ?  ?General transfer comment: good use of UEs to assist up pushing on armrests ?  ? ?Ambulation/Gait ?Ambulation/Gait assistance: Min guard, Supervision ?Gait Distance (Feet): 400 Feet ?Assistive device: None, Rolling walker (2 wheels), Quad cane ?Gait Pattern/deviations: Step-through pattern, Decreased step length - right, Decreased step length - left, Shuffle, Wide base of support ?  ?  ?  ?General Gait Details: Pt ambulated 100' sans AD with min guard for safety; 100' with RW, min guard for safety and assist to manage RW; and 200' with [facility] and close supervision.  Pt with noted instability but with no overt LOB and SBQC providing best option at this time ? ?Stairs ?  ?  ?  ?  ?  ? ?Wheelchair Mobility ?  ? ?Modified Rankin (Stroke Patients Only) ?  ? ?  ? ?Balance Overall balance assessment: Mild deficits observed, not formally tested ?  ?  ?  ?  ?  ?  ?  ?  ?  ?  ?  ?  ?  ?  ?  ?  ?  ?  ?   ? ? ? ?Pertinent Vitals/Pain Pain Assessment ?Pain Assessment: No/denies pain  ? ? ?Home Living Family/patient expects to be discharged to:: Private residence ?Living Arrangements: Children ?Available Help at Discharge: Family;Available 24 hours/day ?Type of Home: House ?Home Access: Stairs to enter ?  ?Entrance Stairs-Number of Steps: 2 ?  ?Home Layout: One level ?Home Equipment: Shower seat;Cane - quad ?   ?  ?Prior Function Prior  Level of Function : Independent/Modified Independent ?  ?  ?  ?  ?  ?  ?Mobility Comments: typically doesn't use a cane ?ADLs Comments: independent ?  ? ? ?Hand Dominance  ? Dominant Hand: Right ? ?  ?Extremity/Trunk Assessment  ? Upper Extremity Assessment ?Upper Extremity Assessment: Defer to OT evaluation ?RUE Deficits / Details: WFL ROM and 5/5 strength ?RUE Sensation: WNL ?RUE Coordination: WNL ?LUE Deficits / Details: WFL ROM, 5/5 strength ?LUE Sensation: WNL ?LUE Coordination: WNL ?   ? ?Lower Extremity Assessment ?Lower Extremity Assessment: Overall WFL for tasks assessed ?  ? ?Cervical / Trunk Assessment ?Cervical / Trunk Assessment: Normal  ?Communication  ? Communication: No difficulties  ?Cognition Arousal/Alertness: Awake/alert ?Behavior During Therapy: [facility] for tasks assessed/performed ?Overall Cognitive Status: Within Functional Limits for tasks assessed ?  ?  ?  ?  ?  ?  ?  ?  ?  ?  ?  ?  ?  ?  ?  ?  ?  ?  ?  ? ?  ?General Comments   ? ?  ?Exercises    ? ?Assessment/Plan  ?  ?PT Assessment Patient needs continued PT services  ?PT Problem List Decreased balance;Decreased mobility;Decreased knowledge of use of DME ? ?   ?  ?PT Treatment Interventions DME instruction;Gait training;Stair training;Functional mobility training;Therapeutic activities;Therapeutic exercise;Patient/family education;Balance training   ? ?PT Goals (Current goals can be found in the Care Plan section)  ?Acute Rehab PT Goals ?Patient Stated Goal: HOME today ?PT Goal Formulation: With patient ?Time For Goal Achievement: 04/07/21 ?Potential to Achieve Goals: Good ? ?  ?Frequency Min 3X/week ?  ? ? ?Co-evaluation   ?  ?  ?  ?  ? ? ?  ?AM-PAC PT "6 Clicks" Mobility  ?Outcome Measure Help needed turning from your back to your side while in a flat bed without using bedrails?: None ?Help needed moving from lying on your back to sitting on the side of a flat bed without using bedrails?: None ?Help needed moving to and from a bed to a chair (including a wheelchair)?: A Little ?Help needed standing up from a chair using your arms (e.g., wheelchair or bedside chair)?: A Little ?Help needed to walk in hospital room?: A Little ?Help needed climbing 3-5 steps with a railing? : A Little ?6 Click Score: 20 ? ?  ?End of Session Equipment Utilized During Treatment: Gait belt ?Activity Tolerance: Patient tolerated treatment well ?Patient left: in chair;with call bell/phone within reach;with chair alarm set ?Nurse Communication: Mobility  status ?PT Visit Diagnosis: Unsteadiness on feet (R26.81);Difficulty in walking, not elsewhere classified (R26.2) ?  ? ?Time: [id number] ?PT Time Calculation (min) (ACUTE ONLY): 20 min ? ? ?Charges:   PT Evaluation ?$PT Eval Low Complexity: 1 Low ?  ?  ?   ? ? ?Debe Coder PT ?Acute Rehabilitation Services ?Pager [phone] ?Office [phone] ? ? ?Wylan Gentzler ?03/24/2021, 12:02 PM ? ?

## 2021-03-24 NOTE — Evaluation (Signed)
Occupational Therapy Evaluation ?Patient Details ?Name: Kristine Sherman ?MRN: [id number] ?DOB: 1928/05/12 ?Today's Date: 03/24/2021 ? ? ?History of Present Illness Kristine Sherman is a 88 y.o. female with medical history significant of CKD, hyperlipidemia, hypertension, hypothyroidism, neuropathy, vertigo, sick sinus syndrome status post pacemaker, anemia, GERD, open-angle glaucoma presenting with ongoing weakness.  ? ?Clinical Impression ?  ?Kristine Sherman is a 88 year old woman who demonstrates ability to perform ADLs and good strength. She was mildly unsteady but can ambulate in room with intermittent hand holds. No complaints of vertigo today. Patient reports living with her son who works from home. She has no current OT needs.   ?   ? ?Recommendations for follow up therapy are one component of a multi-disciplinary discharge planning process, led by the attending physician.  Recommendations may be updated based on patient status, additional functional criteria and insurance authorization.  ? ?Follow Up Recommendations ? No OT follow up  ?  ?Assistance Recommended at Discharge PRN  ?Patient can return home with the following Assistance with cooking/housework ? ?  ?Functional Status Assessment ? Patient has not had a recent decline in their functional status  ?Equipment Recommendations ? None recommended by OT  ?  ?Recommendations for Other Services   ? ? ?  ?Precautions / Restrictions Precautions ?Precautions: Fall  ? ?  ? ?Mobility Bed Mobility ?Overal bed mobility: Independent ?  ?  ?  ?  ?  ?  ?  ?  ? ?Transfers ?Overall transfer level: Needs assistance ?Equipment used: None ?Transfers: Sit to/from Stand, Bed to chair/wheelchair/BSC ?Sit to Stand: Min guard ?  ?  ?  ?  ?  ?General transfer comment: Overall min guard without DME. She uses hand holds on walls and furniture to steady herself. but no overt loss of balance. ?  ? ?  ?Balance Overall balance assessment: Mild deficits observed, not formally tested ?  ?  ?   ?  ?  ?  ?  ?  ?  ?  ?  ?  ?  ?  ?  ?  ?  ?  ?   ? ?ADL either performed or assessed with clinical judgement  ? ?ADL Overall ADL's : Independent ?  ?  ?  ?  ?  ?  ?  ?  ?  ?  ?  ?  ?  ?  ?  ?  ?  ?  ?  ?General ADL Comments: Physically independent with ADLs - supervision for safety due to unsteadiness from recent immobility.  ? ? ? ?Vision Baseline Vision/History: 3 Glaucoma ?Patient Visual Report: No change from baseline ?   ?   ?Perception   ?  ?Praxis   ?  ? ?Pertinent Vitals/Pain Pain Assessment ?Pain Assessment: No/denies pain  ? ? ? ?Hand Dominance Right ?  ?Extremity/Trunk Assessment Upper Extremity Assessment ?Upper Extremity Assessment: RUE deficits/detail;LUE deficits/detail ?RUE Deficits / Details: WFL ROM and 5/5 strength ?RUE Sensation: WNL ?RUE Coordination: WNL ?LUE Deficits / Details: WFL ROM, 5/5 strength ?LUE Sensation: WNL ?LUE Coordination: WNL ?  ?Lower Extremity Assessment ?Lower Extremity Assessment: Defer to PT evaluation ?  ?Cervical / Trunk Assessment ?Cervical / Trunk Assessment: Normal ?  ?Communication Communication ?Communication: No difficulties ?  ?Cognition Arousal/Alertness: Awake/alert ?Behavior During Therapy: [facility] for tasks assessed/performed ?Overall Cognitive Status: Within Functional Limits for tasks assessed ?  ?  ?  ?  ?  ?  ?  ?  ?  ?  ?  ?  ?  ?  ?  ?  ?  ?  ?  ?  General Comments    ? ?  ?Exercises   ?  ?Shoulder Instructions    ? ? ?Home Living Family/patient expects to be discharged to:: Private residence ?Living Arrangements: Children ?Available Help at Discharge: Family;Available 24 hours/day ?Type of Home: House ?Home Access: Stairs to enter ?Entrance Stairs-Number of Steps: 2 ?  ?Home Layout: One level ?  ?  ?Bathroom Shower/Tub: Tub/shower unit ?  ?Bathroom Toilet: Standard ?  ?  ?Home Equipment: Shower seat;Cane - single point ?  ?  ?  ? ?  ?Prior Functioning/Environment Prior Level of Function : Independent/Modified Independent ?  ?  ?  ?  ?  ?  ?Mobility Comments:  typically doesn't use a cane ?ADLs Comments: independent ?  ? ?  ?  ?OT Problem List: Impaired balance (sitting and/or standing) ?  ?   ?OT Treatment/Interventions:    ?  ?OT Goals(Current goals can be found in the care plan section) Acute Rehab OT Goals ?OT Goal Formulation: All assessment and education complete, DC therapy  ?OT Frequency:   ?  ? ?Co-evaluation   ?  ?  ?  ?  ? ?  ?AM-PAC OT "6 Clicks" Daily Activity     ?Outcome Measure Help from another person eating meals?: None ?Help from another person taking care of personal grooming?: None ?Help from another person toileting, which includes using toliet, bedpan, or urinal?: None ?Help from another person bathing (including washing, rinsing, drying)?: None ?Help from another person to put on and taking off regular upper body clothing?: None ?Help from another person to put on and taking off regular lower body clothing?: None ?6 Click Score: 24 ?  ?End of Session Nurse Communication: Mobility status ? ?Activity Tolerance: Patient tolerated treatment well ?Patient left: in chair;with call bell/phone within reach;with chair alarm set ? ?OT Visit Diagnosis: Dizziness and giddiness (R42)  ?              ?Time: 4010-2725 ?OT Time Calculation (min): 16 min ?Charges:  OT General Charges ?$OT Visit: 1 Visit ?OT Evaluation ?$OT Eval Low Complexity: 1 Low ? ?Earnest Mcgillis, OTR/L ?Acute [facility] Re[facility] Services  ?Office [phone] ?Pager: [phone]  ? ?Keyvon Herter L Wynee Matarazzo ?03/24/2021, 10:18 AM ?

## 2021-03-26 LAB — PATHOLOGIST SMEAR REVIEW

## 2021-04-19 ENCOUNTER — Ambulatory Visit (INDEPENDENT_AMBULATORY_CARE_PROVIDER_SITE_OTHER): Payer: Medicare PPO | Admitting: Cardiology

## 2021-04-19 ENCOUNTER — Encounter: Payer: Self-pay | Admitting: Cardiology

## 2021-04-19 VITALS — BP 94/64 | HR 60 | Ht 65.0 in | Wt 128.2 lb

## 2021-04-19 DIAGNOSIS — Z950 Presence of cardiac pacemaker: Secondary | ICD-10-CM

## 2021-04-19 DIAGNOSIS — R42 Dizziness and giddiness: Secondary | ICD-10-CM | POA: Diagnosis not present

## 2021-04-19 NOTE — Progress Notes (Signed)
?Electrophysiology Office Note:   ? ?Date:  04/19/2021  ? ?IDJosenid Sherman, DOB Apr 11, 1928, MRN [id number] ? ?PCP:  Loura Pardon, MD  ?[facility] Cardiologist:  None  ?[facility] Electrophysiologist:  Vickie Epley, MD  ? ?Referring MD: Loura Pardon, MD  ? ?Chief Complaint: Follow-up ? ?History of Present Illness:   ? ?Kristine Sherman is a 88 y.o. female who presents for follow-up. ? ?She was last seen in clinic 02/19/2021, and established care for biotronik PPM implanted 10/16/2014 at the request of Dr. Dorian Pod. She was enrolled in remote monitoring with our device clinic. ? ?Since her last visit, she was admitted to the hospital 03/23/2021 with concern for AKI, dehydration, and dizziness. Creatinine on admission was 1.72 (baseline 1.1), improved to 1.21 following IV fluid treatment. Losartan/HCTZ was held on discharge. It was recommended for her to follow-up with cardiology in the outpatient setting. ? ?She is accompanied by a family member, they recently moved to the area in November. Overall, she appears well. ? ?Initially she had an episode of vertigo, improved with meclizine. About a week later she began to feel worse with nausea and weakness, worse with movement. She felt like she had no control over her movements or bodily functions. This wasn't a constant feeling, but it did gradually worsen for a time prior to her hospitalization. ? ?They report that her PCP discontinued Losartan earlier this morning, to see how she reacts to this over the next 10 days. ? ?She denies any palpitations, chest pain, shortness of breath, or peripheral edema. No headaches, syncope, orthopnea, or PND. ? ?  ?Past Medical History:  ?Diagnosis Date  ? CKD (chronic kidney disease)   ? Glaucoma   ? Hyperlipidemia   ? Hypertension   ? Hypothyroidism   ? Neuropathy   ? Pacemaker   ? Seasonal allergies   ? Vertigo   ? Vitamin B12 deficiency   ? ? ?Past Surgical History:  ?Procedure Laterality Date  ? ABDOMINAL  HYSTERECTOMY    ? DG GALL BLADDER    ? ? ?Current Medications: ?Current Meds  ?Medication Sig  ? acetaminophen (TYLENOL) 325 MG tablet Take 650 mg by mouth every 6 (six) hours as needed for headache.  ? aspirin EC 81 MG tablet Take 81 mg by mouth at bedtime. Swallow whole.  ? atorvastatin (LIPITOR) 40 MG tablet Take 40 mg by mouth every morning.  ? brimonidine (ALPHAGAN) 0.2 % ophthalmic solution Place 1 drop into both eyes 3 (three) times daily.  ? Cyanocobalamin (VITAMIN B-12 SL) Place 500 mcg under the tongue every morning. 1 spray - 500 mcg  ? dorzolamide-timolol (COSOPT) 22.3-6.8 MG/ML ophthalmic solution Place 1 drop into both eyes 2 (two) times daily.  ? gabapentin (NEURONTIN) 100 MG capsule Take 200 mg by mouth at bedtime.  ? levothyroxine (SYNTHROID) 50 MCG tablet Take 50 mcg by mouth daily before breakfast.  ? meclizine (ANTIVERT) 12.5 MG tablet Take 12.5 mg by mouth 3 (three) times daily as needed for dizziness.  ? Netarsudil-Latanoprost (ROCKLATAN) 0.02-0.005 % SOLN Place 1 drop into both eyes at bedtime.  ? Polyethyl Glycol-Propyl Glycol (SYSTANE PRESERVATIVE FREE OP) Place 1 drop into both eyes 4 (four) times daily as needed (dry eyes). Systane Complete Preservative Free  ?  ? ?Allergies:   Oxycodone-aspirin and Sulfa antibiotics  ? ?Social History  ? ?Socioeconomic History  ? Marital status: Single  ?  Spouse name: Not on file  ? Number of children: Not on file  ?  Years of education: Not on file  ? Highest education level: Not on file  ?Occupational History  ? Not on file  ?Tobacco Use  ? Smoking status: Never  ? Smokeless tobacco: Never  ?Substance and Sexual Activity  ? Alcohol use: Yes  ? Drug use: Never  ? Sexual activity: Not on file  ?Other Topics Concern  ? Not on file  ?Social History Narrative  ? Not on file  ? ?Social Determinants of Health  ? ?Financial Resource Strain: Not on file  ?Food Insecurity: Not on file  ?Transportation Needs: Not on file  ?Physical Activity: Not on file  ?Stress:  Not on file  ?Social Connections: Not on file  ?  ? ?Family History: ?The patient's family history includes Breast cancer in her sister; Rheum arthritis in her mother. ? ?ROS:   ?Please see the history of present illness.    ?All other systems reviewed and are negative. ? ?EKGs/Labs/Other Studies Reviewed:   ? ?The following studies were reviewed today: ? ?March 20, 2021 in clinic device check personally reviewed: ?Pacemaker check in clinic to reset home monitoring. Normal device function. Thresholds, sensing, impedances consistent with previous measurements. Device programmed to maximize longevity. No mode switch or high ventricular rates noted. Device programmed  ?at appropriate safety margins. Histogram distribution appropriate for patient activity level. Device programmed to optimize intrinsic conduction. Estimated longevity 4 years 4 months. Patient enrolled in remote follow-up. Confirmed ability to transmit  ?via test transmission.  Patient education completed. ? ?February 19, 2021 in clinic device interrogation personally reviewed ?Battery longevity 4 years 4 months ?Lead parameters stable ?97% atrial pacing, 0% ventricular pacing ? ?Bilateral LE Dopplers 02/01/2021: ?FINDINGS: ?Right Lower Extremity ?  ?ABI: 1.2 ?  ?Inflow: Normal common femoral arterial waveforms and velocities. No ?evidence of inflow (aortoiliac) disease. ?  ?Outflow: Normal profunda femoral, superficial femoral and popliteal ?arterial waveforms and velocities. No focal elevation of the PSV to ?suggest stenosis. ?  ?Runoff: Normal posterior and anterior tibial arterial waveforms and ?velocities. Vessels are patent to the ankle. ?  ?Left Lower Extremity ?  ?ABI: 1.2 ?  ?Inflow: Normal common femoral arterial waveforms and velocities. No ?evidence of inflow (aortoiliac) disease. ?  ?Outflow: Normal profunda femoral, superficial femoral and popliteal ?arterial waveforms and velocities. No focal elevation of the PSV to ?suggest stenosis. ?   ?Runoff: Normal posterior and anterior tibial arterial waveforms and ?velocities. Vessels are patent to the ankle. ?  ?IMPRESSION: ?Mild scattered atherosclerotic plaque without evidence of ?hemodynamically significant stenosis or occlusion in either lower ?extremity. ?  ?Bilateral resting ankle-brachial indices are within normal limits. ? ?EKG:   EKG is personally reviewed.  ?04/19/2021: AV sequential pacing ?02/19/2021: Atrial pacing, ventricular sensing ? ?April 19, 2021 in clinic device interrogation shows stable lead parameters, atrial pacing 99% ?Today I changed the PVARP ? ?Recent Labs: ?03/23/2021: ALT 14; Magnesium 2.2 ?03/24/2021: BUN 27; Creatinine, Ser 1.21; Hemoglobin 9.8; Platelets 150; Potassium 3.7; Sodium 136  ? ?Recent Lipid Panel ?No results found for: CHOL, TRIG, HDL, CHOLHDL, VLDL, LDLCALC, LDLDIRECT ? ?Physical Exam:   ? ?VS:  BP 94/64   Pulse 60   Ht 5\' 5"  (1.651 m)   Wt 128 lb 3.2 oz (58.2 kg)   SpO2 98%   BMI 21.33 kg/m?    ? ?Wt Readings from Last 3 Encounters:  ?04/19/21 128 lb 3.2 oz (58.2 kg)  ?03/23/21 125 lb 7.1 oz (56.9 kg)  ?02/19/21 129 lb 6.4 oz (58.7 kg)  ?  ? ?  GEN: Well nourished, well developed in no acute distress ?HEENT: Normal ?NECK: No JVD; No carotid bruits ?LYMPHATICS: No lymphadenopathy ?CARDIAC: RRR, no murmurs, rubs, gallops; Device pocket well healed ?RESPIRATORY:  Clear to auscultation without rales, wheezing or rhonchi  ?ABDOMEN: Soft, non-tender, non-distended ?MUSCULOSKELETAL:  No edema; No deformity  ?SKIN: Warm and dry ?NEUROLOGIC:  Alert and oriented x 3 ?PSYCHIATRIC:  Normal affect  ? ? ?  ? ?ASSESSMENT:   ? ?1. Pacemaker   ?2. Vertigo   ? ?PLAN:   ? ?In order of problems listed above: ? ?#Pacemaker in situ ?Device functioning appropriately.  We will establish her in our device clinic for remote monitoring ? ?#Vertigo ?Symptoms very classic for vertigo.  She is already in touch with her primary care physician.  Trigger seems to be a diuretic.  She also has  referral in place for physical therapy to consider Epley maneuvers. ? ?Follow-up with me in 1 year or sooner as needed.  APP okay. ? ? ? ? ?Medication Adjustments/Labs and Tests Ordered: ?Current medicines are reviewed at lengt

## 2021-04-19 NOTE — Patient Instructions (Signed)

## 2021-04-19 NOTE — Progress Notes (Deleted)
Admitted 3/11 to [facility] ?Presented with weakness/vertigo ?Establish for pacemaker ? ?

## 2021-05-21 ENCOUNTER — Ambulatory Visit (INDEPENDENT_AMBULATORY_CARE_PROVIDER_SITE_OTHER): Payer: Medicare PPO

## 2021-05-21 DIAGNOSIS — Z950 Presence of cardiac pacemaker: Secondary | ICD-10-CM

## 2021-05-21 LAB — CUP PACEART REMOTE DEVICE CHECK
Date Time Interrogation Session: 20230509092322
Implantable Lead Implant Date: 20161003
Implantable Lead Implant Date: 20161003
Implantable Lead Location: 753859
Implantable Lead Location: 753860
Implantable Lead Model: 377
Implantable Lead Model: 377
Implantable Lead Serial Number: 49295355
Implantable Lead Serial Number: 49335002
Implantable Pulse Generator Implant Date: 20161003
Pulse Gen Model: 394929
Pulse Gen Serial Number: 68595075

## 2021-06-04 NOTE — Progress Notes (Signed)
Remote pacemaker transmission.   

## 2021-08-20 ENCOUNTER — Ambulatory Visit (INDEPENDENT_AMBULATORY_CARE_PROVIDER_SITE_OTHER): Payer: Medicare PPO

## 2021-08-20 DIAGNOSIS — Z950 Presence of cardiac pacemaker: Secondary | ICD-10-CM

## 2021-08-21 LAB — CUP PACEART REMOTE DEVICE CHECK
Date Time Interrogation Session: 20230809060720
Implantable Lead Implant Date: 20161003
Implantable Lead Implant Date: 20161003
Implantable Lead Location: 753859
Implantable Lead Location: 753860
Implantable Lead Model: 377
Implantable Lead Model: 377
Implantable Lead Serial Number: 49295355
Implantable Lead Serial Number: 49335002
Implantable Pulse Generator Implant Date: 20161003
Pulse Gen Model: 394929
Pulse Gen Serial Number: 68595075

## 2021-09-25 NOTE — Progress Notes (Signed)
Remote pacemaker transmission.   

## 2021-11-19 ENCOUNTER — Ambulatory Visit (INDEPENDENT_AMBULATORY_CARE_PROVIDER_SITE_OTHER): Payer: Medicare PPO

## 2021-11-19 DIAGNOSIS — I495 Sick sinus syndrome: Secondary | ICD-10-CM | POA: Diagnosis not present

## 2021-11-19 LAB — CUP PACEART REMOTE DEVICE CHECK
Date Time Interrogation Session: 20231107080901
Implantable Lead Connection Status: 753985
Implantable Lead Connection Status: 753985
Implantable Lead Implant Date: 20161003
Implantable Lead Implant Date: 20161003
Implantable Lead Location: 753859
Implantable Lead Location: 753860
Implantable Lead Model: 377
Implantable Lead Model: 377
Implantable Lead Serial Number: 49295355
Implantable Lead Serial Number: 49335002
Implantable Pulse Generator Implant Date: 20161003
Pulse Gen Model: 394929
Pulse Gen Serial Number: 68595075

## 2021-12-13 NOTE — Progress Notes (Signed)
Remote pacemaker transmission.   

## 2022-01-09 ENCOUNTER — Encounter: Payer: Self-pay | Admitting: Family

## 2022-01-09 ENCOUNTER — Ambulatory Visit: Payer: Medicare PPO | Admitting: Family

## 2022-01-09 VITALS — BP 130/80 | HR 71 | Temp 97.4°F | Resp 16 | Ht 65.0 in | Wt 126.8 lb

## 2022-01-09 DIAGNOSIS — I10 Essential (primary) hypertension: Secondary | ICD-10-CM

## 2022-01-09 DIAGNOSIS — H6121 Impacted cerumen, right ear: Secondary | ICD-10-CM

## 2022-01-09 DIAGNOSIS — E785 Hyperlipidemia, unspecified: Secondary | ICD-10-CM

## 2022-01-09 DIAGNOSIS — E538 Deficiency of other specified B group vitamins: Secondary | ICD-10-CM

## 2022-01-09 DIAGNOSIS — D649 Anemia, unspecified: Secondary | ICD-10-CM

## 2022-01-09 DIAGNOSIS — N1832 Chronic kidney disease, stage 3b: Secondary | ICD-10-CM

## 2022-01-09 DIAGNOSIS — E039 Hypothyroidism, unspecified: Secondary | ICD-10-CM

## 2022-01-09 DIAGNOSIS — L84 Corns and callosities: Secondary | ICD-10-CM | POA: Diagnosis not present

## 2022-01-09 DIAGNOSIS — R42 Dizziness and giddiness: Secondary | ICD-10-CM

## 2022-01-09 DIAGNOSIS — Z7689 Persons encountering health services in other specified circumstances: Secondary | ICD-10-CM

## 2022-01-09 MED ORDER — DEBROX 6.5 % OT SOLN: 5.0000 [drp] | Freq: Two times a day (BID) | OTIC | 0 refills | Status: AC

## 2022-01-09 NOTE — Progress Notes (Signed)
Provider: Marlowe Sax FNP-C   Rawan Riendeau, Nelda Bucks, NP  Patient Care Team: Lamir Racca, Nelda Bucks, NP as PCP - General (Family Medicine) Vickie Epley, MD as PCP - Electrophysiology (Cardiology)  Extended Emergency Contact Information Primary Emergency Contact: Louderback,William Address: [place], [place] [address] Johnnette Litter of [place] Phone: [phone] Relation: Son Secondary Emergency Contact: Hayes,Josie Mobile Phone: [phone] Relation: Sister Preferred language: English Interpreter needed? No  Code Status:  Full Code  Goals of care: Advanced Directive information    03/24/2021   12:00 PM  Advanced Directives  Does Patient Have a Medical Advance Directive? No  Would patient like information on creating a medical advance directive? Yes (Inpatient - patient requests chaplain consult to create a medical advance directive)     Chief Complaint  Patient presents with   Establish Care    New Patient     HPI:  Pt is a 88 y.o. female seen today establish care here at [facility] for medical management of chronic diseases.Has a medical history of Hypothyroidism, chronic Kidney disease stage 3,Hyperlipidemia ,Glaucoma ,vitamin B1 2 deficiency,HOH,Peripheral Neuropathy, occasional vertigo none in the past one year among others. Follows up with [facility] every 4 months for Glaucoma.   Had intense pain under her left rib in the past two weeks. Had cough 3 weeks ago.she applied Biofreeze with relief. Tries Walks 3/4 mile every day when the weather is good.   Past Medical History:  Diagnosis Date   CKD (chronic kidney disease)    Glaucoma    Hyperlipidemia    Hypertension    Hypothyroidism    Neuropathy    Pacemaker    Seasonal allergies    Vertigo    Vitamin B12 deficiency    Past Surgical History:  Procedure Laterality Date   ABDOMINAL HYSTERECTOMY     DG GALL BLADDER      Allergies  Allergen  Reactions   Oxycodone-Aspirin Other (See Comments)    Neurologic reaction - caused seizures, passed out (reaction to Percodan)     Sulfa Antibiotics Hives, Itching and Rash    Allergies as of 01/09/2022       Reactions   Oxycodone-aspirin Other (See Comments)   Neurologic reaction - caused seizures, passed out (reaction to Percodan)   Sulfa Antibiotics Hives, Itching, Rash        Medication List        Accurate as of January 09, 2022  8:59 AM. If you have any questions, ask your nurse or doctor.          acetaminophen 325 MG tablet Commonly known as: TYLENOL Take 650 mg by mouth every 6 (six) hours as needed for headache.   aspirin EC 81 MG tablet Take 81 mg by mouth at bedtime. Swallow whole.   atorvastatin 40 MG tablet Commonly known as: LIPITOR Take 40 mg by mouth every morning.   brimonidine 0.2 % ophthalmic solution Commonly known as: ALPHAGAN Place 1 drop into both eyes 3 (three) times daily.   dorzolamide-timolol 2-0.5 % ophthalmic solution Commonly known as: COSOPT Place 1 drop into both eyes 2 (two) times daily.   gabapentin 100 MG capsule Commonly known as: NEURONTIN Take 200 mg by mouth at bedtime.   levothyroxine 50 MCG tablet Commonly known as: SYNTHROID Take 50 mcg by mouth daily before breakfast.   meclizine 12.5 MG tablet Commonly known as: ANTIVERT Take 12.5  mg by mouth 3 (three) times daily as needed for dizziness.   Rocklatan 0.02-0.005 % Soln Generic drug: Netarsudil-Latanoprost Place 1 drop into both eyes at bedtime.   SYSTANE PRESERVATIVE FREE OP Place 1 drop into both eyes 4 (four) times daily as needed (dry eyes). Systane Complete Preservative Free   VITAMIN B-12 SL Place 500 mcg under the tongue every morning. 1 spray - 500 mcg        Review of Systems  Constitutional:  Negative for appetite change, chills, fatigue, fever and unexpected weight change.  HENT:  Negative for congestion, dental problem, ear discharge,  ear pain, facial swelling, hearing loss, nosebleeds, postnasal drip, rhinorrhea, sinus pressure, sinus pain, sneezing, sore throat, tinnitus and trouble swallowing.   Eyes:  Positive for visual disturbance. Negative for pain, discharge, redness and itching.       Glaucoma follows up with Ophthalmologist. Wears eye glasses.  Respiratory:  Negative for cough, chest tightness, shortness of breath and wheezing.   Cardiovascular:  Negative for chest pain, palpitations and leg swelling.  Gastrointestinal:  Negative for abdominal distention, abdominal pain, blood in stool, constipation, diarrhea, nausea and vomiting.  Endocrine: Negative for cold intolerance, heat intolerance, polydipsia, polyphagia and polyuria.  Genitourinary:  Negative for difficulty urinating, dysuria, flank pain, frequency and urgency.       Less volume due to CKD 3 gets up once at night   Musculoskeletal:  Positive for gait problem. Negative for arthralgias, back pain, joint swelling, myalgias, neck pain and neck stiffness.  Skin:  Negative for color change, pallor, rash and wound.  Neurological:  Negative for dizziness, syncope, speech difficulty, weakness, light-headedness, numbness and headaches.  Hematological:  Does not bruise/bleed easily.  Psychiatric/Behavioral:  Negative for agitation, behavioral problems, confusion, hallucinations, self-injury, sleep disturbance and suicidal ideas. The patient is not nervous/anxious.        Memory loss  Sleeps 6-7 hrs at night.    Immunization History  Administered Date(s) Administered   PFIZER(Purple Top)SARS-COV-2 Vaccination 01/19/2019, 02/07/2019, 10/11/2019   Pertinent  Health Maintenance Due  Topic Date Due   DEXA SCAN  Never done   INFLUENZA VACCINE  08/13/2021      04/30/2020   12:33 PM 03/23/2021    4:18 PM 03/23/2021   11:00 PM 03/24/2021   12:00 PM  Fall Risk  Patient Fall Risk Level Low fall risk Moderate fall risk Moderate fall risk High fall risk   Functional  Status Survey:    Vitals:   01/09/22 0857  BP: 130/80  Resp: 16  Temp: (!) 97.4 F (36.3 C)  Weight: 126 lb 12.8 oz (57.5 kg)  Height: _0  (1.651 m)   Body mass index is 21.1 kg/m. Physical Exam Vitals reviewed.  Constitutional:      General: She is not in acute distress.    Appearance: Normal appearance. She is normal weight. She is not ill-appearing or diaphoretic.  HENT:     Head: Normocephalic.     Right Ear: Tympanic membrane, ear canal and external ear normal. There is no impacted cerumen.     Left Ear: Tympanic membrane, ear canal and external ear normal. There is no impacted cerumen.     Nose: Nose normal. No congestion or rhinorrhea.     Mouth/Throat:     Mouth: Mucous membranes are moist.     Pharynx: Oropharynx is clear. No oropharyngeal exudate or posterior oropharyngeal erythema.  Eyes:     General: No scleral icterus.  Right eye: No discharge.        Left eye: No discharge.     Extraocular Movements: Extraocular movements intact.     Conjunctiva/sclera: Conjunctivae normal.     Pupils: Pupils are equal, round, and reactive to light.  Neck:     Vascular: No carotid bruit.  Cardiovascular:     Rate and Rhythm: Normal rate and regular rhythm.     Pulses: Normal pulses.     Heart sounds: Normal heart sounds. No murmur heard.    No friction rub. No gallop.  Pulmonary:     Effort: Pulmonary effort is normal. No respiratory distress.     Breath sounds: Normal breath sounds. No wheezing, rhonchi or rales.  Chest:     Chest wall: No tenderness.  Abdominal:     General: Bowel sounds are normal. There is no distension.     Palpations: Abdomen is soft. There is no mass.     Tenderness: There is no abdominal tenderness. There is no right CVA tenderness, left CVA tenderness, guarding or rebound.  Musculoskeletal:        General: No swelling or tenderness. Normal range of motion.     Cervical back: Normal range of motion. No rigidity or tenderness.     Right  lower leg: No edema.     Left lower leg: No edema.  Feet:     Right foot:     Skin integrity: Callus present. No ulcer, blister, skin breakdown, erythema or warmth.     Left foot:     Skin integrity: No ulcer, skin breakdown, erythema, warmth or callus.  Lymphadenopathy:     Cervical: No cervical adenopathy.  Skin:    General: Skin is warm and dry.     Coloration: Skin is not pale.     Findings: No bruising, erythema, lesion or rash.  Neurological:     Mental Status: She is alert and oriented to person, place, and time.     Cranial Nerves: No cranial nerve deficit.     Sensory: No sensory deficit.     Motor: No weakness.     Coordination: Coordination normal.     Gait: Gait normal.  Psychiatric:        Mood and Affect: Mood normal.        Speech: Speech normal.        Behavior: Behavior normal.        Thought Content: Thought content normal.        Judgment: Judgment normal.     Labs reviewed: Recent Labs    03/23/21 1701 03/23/21 2239 03/24/21 0613  NA 134*  --  136  K 3.3*  --  3.7  CL 102  --  105  CO2 24  --  22  GLUCOSE 103*  --  79  BUN 40*  --  27*  CREATININE 1.72*  --  1.21*  CALCIUM 8.5*  --  8.6*  MG  --  2.2  --    Recent Labs    03/23/21 1701  AST 18  ALT 14  ALKPHOS 70  BILITOT 0.4  PROT 6.6  ALBUMIN 3.3*   Recent Labs    03/23/21 1701 03/24/21 0757  WBC 9.3 8.3  NEUTROABS 6.9  --   HGB 10.6* 9.8*  HCT 34.0* 32.0*  MCV 71.4* 71.4*  PLT 153 150   No results found for: "TSH" No results found for: "HGBA1C" No results found for: "CHOL", "HDL", "LDLCALC", "LDLDIRECT", "TRIG", "CHOLHDL"  Significant Diagnostic Results in last 30 days:  No results found.  Assessment/Plan 1. Encounter to establish care with new doctor Available medical records reviewed, will bring immunization card then update immunization on chart.  Also recommended fasting blood work today states has not had anything to eat.  2. Hyperlipidemia LDL goal <100 No  latest LDL for review Dietary modification and exercise as tolerated - Lipid panel  3. Hypothyroidism (acquired) No latest TSH for review  -Continue on levothyroxine 50 mcg daily on an empty stomach - TSH - COMPLETE METABOLIC PANEL WITH GFR  4. Vitamin B12 deficiency Continue on vitamin B-12 sublingual supplement - CBC with Differential/Platelet - Vitamin B12  5. Callus of foot Hard cone noted on  - Ambulatory referral to Podiatry  6. Stage 3b chronic kidney disease ([place]) Reports in stage 3  - will obtain lab work then send to Nephrologist if needed   - COMPLETE METABOLIC PANEL WITH GFR  7. Chronic anemia Reports anemic but no recent [place] work  - CBC with Differential/Platelet  8. Vertigo Stable on meclizine Continue to monitor  9. Essential hypertension blood pressure well-controlled - COMPLETE METABOLIC PANEL WITH GFR  10. Impacted cerumen of right ear TM not visualized due to cerumen impaction -Advised to instill debrox 6.5 otic solution 5 drops into each ear twice daily x 4 days then follow up for ear lavage.May apply cotton ball at bedtime to prevent drainage to pillow.  - carbamide peroxide (DEBROX) 6.5 % OTIC solution; Place 5 drops into the right ear 2 (two) times daily for 4 days.  Dispense: 2 mL; Refill: 0  Family/ staff Communication: Reviewed plan of care with patient and daughter verbalized understanding  Labs/tests ordered:  - CBC with Differential/Platelet - CMP with eGFR([facility]) - TSH - Hgb A1C - Lipid panel - Vitamin B 12 - Hep C Antibody  Next Appointment : Return in about 6 months (around 07/11/2022) for medical mangement of chronic issues.one week for right ear lavage .   Sandrea Hughs, NP

## 2022-01-10 LAB — CBC WITH DIFFERENTIAL/PLATELET
Absolute Monocytes: 476 cells/uL (ref 200–950)
Basophils Absolute: 48 cells/uL (ref 0–200)
Basophils Relative: 0.7 %
Eosinophils Absolute: 204 cells/uL (ref 15–500)
Eosinophils Relative: 3 %
HCT: 35.4 % (ref 35.0–45.0)
Hemoglobin: 10.8 g/dL — ABNORMAL LOW (ref 11.7–15.5)
Lymphs Abs: 1523 cells/uL (ref 850–3900)
MCH: 22 pg — ABNORMAL LOW (ref 27.0–33.0)
MCHC: 30.5 g/dL — ABNORMAL LOW (ref 32.0–36.0)
MCV: 72 fL — ABNORMAL LOW (ref 80.0–100.0)
Monocytes Relative: 7 %
Neutro Abs: 4549 cells/uL (ref 1500–7800)
Neutrophils Relative %: 66.9 %
Platelets: 187 10*3/uL (ref 140–400)
RBC: 4.92 10*6/uL (ref 3.80–5.10)
RDW: 15.8 % — ABNORMAL HIGH (ref 11.0–15.0)
Total Lymphocyte: 22.4 %
WBC: 6.8 10*3/uL (ref 3.8–10.8)

## 2022-01-10 LAB — COMPLETE METABOLIC PANEL WITH GFR
AG Ratio: 1.2 (calc) (ref 1.0–2.5)
ALT: 12 U/L (ref 6–29)
AST: 18 U/L (ref 10–35)
Albumin: 3.8 g/dL (ref 3.6–5.1)
Alkaline phosphatase (APISO): 97 U/L (ref 37–153)
BUN/Creatinine Ratio: 10 (calc) (ref 6–22)
BUN: 12 mg/dL (ref 7–25)
CO2: 26 mmol/L (ref 20–32)
Calcium: 9.3 mg/dL (ref 8.6–10.4)
Chloride: 102 mmol/L (ref 98–110)
Creat: 1.21 mg/dL — ABNORMAL HIGH (ref 0.60–0.95)
Globulin: 3.2 g/dL (calc) (ref 1.9–3.7)
Glucose, Bld: 82 mg/dL (ref 65–99)
Potassium: 4.9 mmol/L (ref 3.5–5.3)
Sodium: 136 mmol/L (ref 135–146)
Total Bilirubin: 0.7 mg/dL (ref 0.2–1.2)
Total Protein: 7 g/dL (ref 6.1–8.1)
eGFR: 42 mL/min/{1.73_m2} — ABNORMAL LOW (ref >=60)

## 2022-01-10 LAB — VITAMIN B12: Vitamin B-12: 565 pg/mL (ref 200–1100)

## 2022-01-10 LAB — LIPID PANEL
Cholesterol: 152 mg/dL (ref <200)
HDL: 50 mg/dL (ref >=50)
LDL Cholesterol (Calc): 78 mg/dL (calc)
Non-HDL Cholesterol (Calc): 102 mg/dL (calc) (ref <130)
Total CHOL/HDL Ratio: 3 (calc) (ref <5.0)
Triglycerides: 143 mg/dL (ref <150)

## 2022-01-10 LAB — TSH: TSH: 2.62 mIU/L (ref 0.40–4.50)

## 2022-01-16 ENCOUNTER — Encounter: Payer: Medicare PPO | Admitting: Family

## 2022-01-16 ENCOUNTER — Ambulatory Visit (INDEPENDENT_AMBULATORY_CARE_PROVIDER_SITE_OTHER): Payer: Medicare PPO | Admitting: Adult Health

## 2022-01-16 ENCOUNTER — Encounter: Payer: Self-pay | Admitting: Adult Health

## 2022-01-16 VITALS — BP 114/70 | HR 74 | Temp 97.5°F | Ht 65.0 in | Wt 127.0 lb

## 2022-01-16 DIAGNOSIS — E039 Hypothyroidism, unspecified: Secondary | ICD-10-CM | POA: Diagnosis not present

## 2022-01-16 DIAGNOSIS — I10 Essential (primary) hypertension: Secondary | ICD-10-CM

## 2022-01-16 DIAGNOSIS — N1832 Chronic kidney disease, stage 3b: Secondary | ICD-10-CM | POA: Diagnosis not present

## 2022-01-16 DIAGNOSIS — H6121 Impacted cerumen, right ear: Secondary | ICD-10-CM | POA: Diagnosis not present

## 2022-01-16 DIAGNOSIS — H6123 Impacted cerumen, bilateral: Secondary | ICD-10-CM

## 2022-01-16 NOTE — Patient Instructions (Signed)
Ear Irrigation Ear irrigation is a procedure to wash dirt and wax out of your ear canal. This procedure is also called lavage. You may need ear irrigation if you are having trouble hearing because of a buildup of earwax. You may also have ear irrigation as part of the treatment for an ear infection. Getting wax and dirt out of your ear canal can help ear drops work better. Tell a health care provider about: Any allergies you have. All medicines you are taking, including vitamins, herbs, eye drops, creams, and over-the-counter medicines. Any problems you or family members have had with anesthetic medicines. Any blood disorders you have. Any surgeries you have had. This includes any ear surgeries. Any medical conditions you have. Whether you are pregnant or may be pregnant. What are the risks? Generally, this is a safe procedure. However, problems may occur, including: Infection. Pain. Hearing loss. Fluid and debris being pushed through the eardrum and into the middle ear. This can occur if there are holes in the eardrum. Ear irrigation failing to work. What happens before the procedure? You will talk with your provider about the procedure and plan. You may be given ear drops to put in your ear 15-20 minutes before irrigation. This helps loosen the wax. What happens during the procedure?  A syringe is filled with water or saline solution, which is made of salt and water. The syringe is gently inserted into the ear canal. The fluid is used to flush out wax and other debris. The procedure may vary among health care providers and hospitals. What can I expect after the procedure? After an ear irrigation, follow instructions given to you by your health care provider. Follow these instructions at home: Using ear irrigation kits Ear irrigation kits are available for use at home. Ask your health care provider if this is an option for you. In general, you should: Use a home irrigation kit only as  told by your health care provider. Read the package instructions carefully. Follow the directions for using the syringe. Use water that is room temperature. Do not do ear irrigation at home if you: Have diabetes. Diabetes increases the risk of infection. Have a hole or tear in your eardrum. Have tubes in your ears. Have had any ear surgery in the past. Have been told not to irrigate your ears. Cleaning your ears  Clean the outside of your ear with a soft washcloth daily. If told by your health care provider, use a few drops of baby oil, mineral oil, glycerin, hydrogen peroxide, or over-the-counter earwax softening drops. Do not use cotton swabs to clean your ears. These can push wax down into the ear canal. Do not put anything into your ears to try to remove wax. This includes ear candles. General instructions Take over-the-counter and prescription medicines only as told by your health care provider. If you were prescribed an antibiotic medicine, use it as told by your health care provider. Do not stop using the antibiotic even if your condition improves. Keep the ear clean and dry by following the instructions from your health care provider. Keep all follow-up visits. This is important. Visit your health care provider at least once a year to have your ears and hearing checked. Contact a health care provider if: Your hearing is not improving or is getting worse. You have pain or redness in your ear. You are dizzy. You have ringing in your ears. You have nausea or vomiting. You have fluid, blood, or pus coming out  of your ear. Summary Ear irrigation is a procedure to wash dirt and wax out of your ear canal. This procedure is also called lavage. To perform ear irrigation, ear drops may be put in your ear 15-20 minutes before irrigation. Water or saline solution will be used to flush out earwax and other debris. You may be able to irrigate your ears at home. Ask your health care provider  if this is an option for you. Follow your health care provider's instructions. Clean your ears with a soft cloth after irrigation. Do not use cotton swabs to clean your ears. These can push wax down into the ear canal. This information is not intended to replace advice given to you by your health care provider. Make sure you discuss any questions you have with your health care provider. Document Revised: 04/19/2019 Document Reviewed: 04/19/2019 Elsevier Patient Education  2023 Elsevier Inc.  

## 2022-01-16 NOTE — Progress Notes (Signed)
[facility]  Provider:  Durenda Age DNP  Code Status:  Full Code  Goals of Care:     01/09/2022    9:00 AM  Advanced Directives  Does Patient Have a Medical Advance Directive? Yes  Type of Paramedic of [place];Living will  Does patient want to make changes to medical advance directive? No - Patient declined  Copy of [place] in Chart? No - copy requested     Chief Complaint  Patient presents with   Acute Visit    Patient presents today for right ear check.    HPI: Patient is a 89 y.o. female seen today for an acute visit for  ear lavage. He completed Debrox ear drop to right ear. She denies pain to right ear. She recently moved to [facility] from [facility] 11/2020.  Past Medical History:  Diagnosis Date   CKD (chronic kidney disease)    Glaucoma    Hyperlipidemia    Hypertension    Hypothyroidism    Neuropathy    Pacemaker    Seasonal allergies    Vertigo    Vitamin B12 deficiency     Past Surgical History:  Procedure Laterality Date   ABDOMINAL HYSTERECTOMY     DG GALL BLADDER      Allergies  Allergen Reactions   Oxycodone-Aspirin Other (See Comments)    Neurologic reaction - caused seizures, passed out (reaction to Percodan)     Sulfa Antibiotics Hives, Itching and Rash    Outpatient Encounter Medications as of 01/16/2022  Medication Sig   acetaminophen (TYLENOL) 325 MG tablet Take 650 mg by mouth every 6 (six) hours as needed for headache.   aspirin EC 81 MG tablet Take 81 mg by mouth at bedtime. Swallow whole.   atorvastatin (LIPITOR) 40 MG tablet Take 40 mg by mouth every morning.   brimonidine (ALPHAGAN) 0.2 % ophthalmic solution Place 1 drop into both eyes 3 (three) times daily.   Cyanocobalamin (VITAMIN B-12 SL) Place 500 mcg under the tongue every morning. 1 spray - 500 mcg   dorzolamide-timolol (COSOPT) 22.3-6.8 MG/ML ophthalmic solution Place 1 drop into both eyes 2 (two) times  daily.   gabapentin (NEURONTIN) 100 MG capsule Take 200 mg by mouth at bedtime.   levothyroxine (SYNTHROID) 50 MCG tablet Take 50 mcg by mouth daily before breakfast.   meclizine (ANTIVERT) 12.5 MG tablet Take 12.5 mg by mouth 3 (three) times daily as needed for dizziness.   Netarsudil-Latanoprost (ROCKLATAN) 0.02-0.005 % SOLN Place 1 drop into both eyes at bedtime.   Polyethyl Glycol-Propyl Glycol (SYSTANE PRESERVATIVE FREE OP) Place 1 drop into both eyes 4 (four) times daily as needed (dry eyes). Systane Complete Preservative Free   No facility-administered encounter medications on file as of 01/16/2022.    Review of Systems:  Review of Systems  Constitutional:  Negative for appetite change, chills, fatigue and fever.  HENT:  Negative for congestion, hearing loss, rhinorrhea and sore throat.   Eyes: Negative.   Respiratory:  Negative for cough, shortness of breath and wheezing.   Cardiovascular:  Negative for chest pain, palpitations and leg swelling.  Gastrointestinal:  Negative for abdominal pain, constipation, diarrhea, nausea and vomiting.  Genitourinary:  Negative for dysuria.  Musculoskeletal:  Negative for arthralgias, back pain and myalgias.  Skin:  Negative for color change, rash and wound.  Neurological:  Negative for dizziness, weakness and headaches.  Psychiatric/Behavioral:  Negative for behavioral problems. The patient is not nervous/anxious.  Health Maintenance  Topic Date Due   Medicare Annual Wellness (AWV)  Never done   DEXA SCAN  Never done   Zoster Vaccines- Shingrix (2 of 2) 03/17/2017   INFLUENZA VACCINE  08/13/2021   COVID-19 Vaccine (4 - 2023-24 season) 09/13/2021   DTaP/Tdap/Td (2 - Td or Tdap) 07/30/2027   Pneumonia Vaccine 65+ Years old  Completed   HPV VACCINES  Aged Out    Physical Exam: Vitals:   01/16/22 1349  BP: 114/70  Pulse: 74  Temp: (!) 97.5 F (36.4 C)  SpO2: 96%  Weight: 127 lb (57.6 kg)  Height: 5' 5" (1.651 m)   Body mass  index is 21.13 kg/m. Physical Exam Constitutional:      Appearance: Normal appearance.  HENT:     Head: Normocephalic and atraumatic.     Nose: Nose normal.     Mouth/Throat:     Mouth: Mucous membranes are moist.  Eyes:     Conjunctiva/sclera: Conjunctivae normal.     Comments: Left eye blurry  Cardiovascular:     Rate and Rhythm: Normal rate and regular rhythm.     Comments: Left chest pacemaker Pulmonary:     Effort: Pulmonary effort is normal.     Breath sounds: Normal breath sounds.  Abdominal:     General: Bowel sounds are normal.     Palpations: Abdomen is soft.  Musculoskeletal:        General: Normal range of motion.     Cervical back: Normal range of motion.  Skin:    General: Skin is warm and dry.  Neurological:     General: No focal deficit present.     Mental Status: She is alert and oriented to person, place, and time.  Psychiatric:        Mood and Affect: Mood normal.        Behavior: Behavior normal.        Thought Content: Thought content normal.        Judgment: Judgment normal.    Labs reviewed: Basic Metabolic Panel: Recent Labs    03/23/21 1701 03/23/21 2239 03/24/21 0613 01/09/22 0944  NA 134*  --  136 136  K 3.3*  --  3.7 4.9  CL 102  --  105 102  CO2 24  --  22 26  GLUCOSE 103*  --  79 82  BUN 40*  --  27* 12  CREATININE 1.72*  --  1.21* 1.21*  CALCIUM 8.5*  --  8.6* 9.3  MG  --  2.2  --   --   TSH  --   --   --  2.62   Liver Function Tests: Recent Labs    03/23/21 1701 01/09/22 0944  AST 18 18  ALT 14 12  ALKPHOS 70  --   BILITOT 0.4 0.7  PROT 6.6 7.0  ALBUMIN 3.3*  --    Recent Labs    03/23/21 1701  LIPASE 55*   No results for input(s): "AMMONIA" in the last 8760 hours. CBC: Recent Labs    03/23/21 1701 03/24/21 0757 01/09/22 0944  WBC 9.3 8.3 6.8  NEUTROABS 6.9  --  4,549  HGB 10.6* 9.8* 10.8*  HCT 34.0* 32.0* 35.4  MCV 71.4* 71.4* 72.0*  PLT 153 150 187   Lipid Panel: Recent Labs    01/09/22 0944   CHOL 152  HDL 50  LDLCALC 78  TRIG 143  CHOLHDL 3.0   No results found for: "HGBA1C"  Procedures   since last visit: No results found.  Assessment/Plan  1. Right ear impacted cerumen -  ear lavage done, obtained moderate amount of cerumen  2. Stage 3b chronic kidney disease ([facility]) Lab Results  Component Value Date   NA 136 01/09/2022   K 4.9 01/09/2022   CO2 26 01/09/2022   GLUCOSE 82 01/09/2022   BUN 12 01/09/2022   CREATININE 1.21 (H) 01/09/2022   CALCIUM 9.3 01/09/2022   EGFR 42 (L) 01/09/2022   GFRNONAA 43 (L) 03/24/2021   -  stable  3. Hypothyroidism (acquired) Lab Results  Component Value Date   TSH 2.62 01/09/2022   -  continue Levothyroxine  4. Essential hypertension -  BP 114/70, stable -  not on medications -  walks 2-3X/week , 30 mins each   Labs/tests ordered:   None  Next appt:  06/30/2022  

## 2022-01-17 ENCOUNTER — Ambulatory Visit: Payer: Medicare PPO | Admitting: Student

## 2022-01-20 ENCOUNTER — Telehealth: Payer: Self-pay

## 2022-01-20 DIAGNOSIS — H9193 Unspecified hearing loss, bilateral: Secondary | ICD-10-CM

## 2022-01-20 NOTE — Telephone Encounter (Signed)
Patient's son left message requesting ENT referral. Patient came in on 01/16/22 and had ear lavage done, but is still having problems with hearing.  Message sent to Marlowe Sax, NP

## 2022-01-21 NOTE — Telephone Encounter (Signed)
Spoke with patient's son Gwyndolyn Saxon and he is aware that ENT referral placed.

## 2022-01-26 NOTE — Progress Notes (Signed)
This encounter was created in error - please disregard. Provider was out of office. 

## 2022-01-27 ENCOUNTER — Other Ambulatory Visit: Payer: Self-pay

## 2022-01-27 MED ORDER — LEVOTHYROXINE SODIUM 50 MCG PO TABS
50.0000 ug | ORAL_TABLET | Freq: Every day | ORAL | 1 refills | Status: DC
  Filled 2022-01-27: qty 90, 90d supply, fill #0
  Filled 2022-01-27: qty 89, 89d supply, fill #0

## 2022-01-27 NOTE — Telephone Encounter (Signed)
From: Damaria Fernholz To: Office of Sandrea Hughs, NP Sent: 01/26/2022 4:33 PM EST Subject: Medication Renewal Request  Refills have been requested for the following medications:   levothyroxine (SYNTHROID) 50 MCG tablet  Preferred pharmacy: [facility] Delivery method: Arlyss Gandy

## 2022-01-30 ENCOUNTER — Ambulatory Visit: Payer: Medicare PPO | Admitting: Podiatry

## 2022-01-30 ENCOUNTER — Other Ambulatory Visit: Payer: Self-pay

## 2022-02-04 ENCOUNTER — Ambulatory Visit: Payer: Medicare PPO | Admitting: Podiatry

## 2022-02-04 DIAGNOSIS — Q828 Other specified congenital malformations of skin: Secondary | ICD-10-CM | POA: Diagnosis not present

## 2022-02-04 NOTE — Progress Notes (Signed)
Subjective:  Patient ID: Kristine Sherman, female    DOB: [birth year],  MRN: [id number]  Chief Complaint  Patient presents with   Callouses    Right foot callous below 5th toe     89 y.o. female presents with the above complaint.  Patient presents with left submetatarsal 5 porokeratotic lesion painful to touch is progressive gotten worse hurts with ambulation worse with pressure he would like to have it debrided down she has not seen anyone as prior to seeing me.  She denies any other acute complaints she is not a diabetic.   Review of Systems: Negative except as noted in the HPI. Denies N/V/F/Ch.  Past Medical History:  Diagnosis Date   CKD (chronic kidney disease)    Glaucoma    Hyperlipidemia    Hypertension    Hypothyroidism    Neuropathy    Pacemaker    Seasonal allergies    Vertigo    Vitamin B12 deficiency     Current Outpatient Medications:    acetaminophen (TYLENOL) 325 MG tablet, Take 650 mg by mouth every 6 (six) hours as needed for headache., Disp: , Rfl:    aspirin EC 81 MG tablet, Take 81 mg by mouth at bedtime. Swallow whole., Disp: , Rfl:    atorvastatin (LIPITOR) 40 MG tablet, Take 40 mg by mouth every morning., Disp: , Rfl:    brimonidine (ALPHAGAN) 0.2 % ophthalmic solution, Place 1 drop into both eyes 3 (three) times daily., Disp: , Rfl:    Cyanocobalamin (VITAMIN B-12 SL), Place 500 mcg under the tongue every morning. 1 spray - 500 mcg, Disp: , Rfl:    dorzolamide-timolol (COSOPT) 22.3-6.8 MG/ML ophthalmic solution, Place 1 drop into both eyes 2 (two) times daily., Disp: , Rfl:    gabapentin (NEURONTIN) 100 MG capsule, Take 200 mg by mouth at bedtime., Disp: , Rfl:    levothyroxine (SYNTHROID) 50 MCG tablet, Take 1 tablet (50 mcg total) by mouth daily before breakfast., Disp: 90 tablet, Rfl: 1   meclizine (ANTIVERT) 12.5 MG tablet, Take 12.5 mg by mouth 3 (three) times daily as needed for dizziness., Disp: , Rfl:    Netarsudil-Latanoprost (ROCKLATAN) 0.02-0.005  % SOLN, Place 1 drop into both eyes at bedtime., Disp: , Rfl:    Polyethyl Glycol-Propyl Glycol (SYSTANE PRESERVATIVE FREE OP), Place 1 drop into both eyes 4 (four) times daily as needed (dry eyes). Systane Complete Preservative Free, Disp: , Rfl:   Social History   Tobacco Use  Smoking Status Never  Smokeless Tobacco Never    Allergies  Allergen Reactions   Oxycodone-Aspirin Other (See Comments)    Neurologic reaction - caused seizures, passed out (reaction to Percodan)     Sulfa Antibiotics Hives, Itching and Rash   Objective:  There were no vitals filed for this visit. There is no height or weight on file to calculate BMI. Constitutional Well developed. Well nourished.  Vascular Dorsalis pedis pulses palpable bilaterally. Posterior tibial pulses palpable bilaterally. Capillary refill normal to all digits.  No cyanosis or clubbing noted. Pedal hair growth normal.  Neurologic Normal speech. Oriented to person, place, and time. Epicritic sensation to light touch grossly present bilaterally.  Dermatologic Left submetatarsal 5 porokeratotic lesion/hyperkeratotic lesion.  Central nucleated core noted.  Pain on palpation to the lesion.  Orthopedic: Normal joint ROM without pain or crepitus bilaterally. No visible deformities. No bony tenderness.   Radiographs: None Assessment:   1. Porokeratosis    Plan:  Patient was evaluated and treated and all questions  answered.  Left submetatarsal 5 porokeratosis -All questions and concerns were discussed with the patient -As a courtesy the lesion was debrided down to healthy striated tissue no complication noted no pinpoint bleeding noted. -I discussed with her offloading and shoe gear modification as well.  She states understanding  No follow-ups on file.

## 2022-02-18 ENCOUNTER — Ambulatory Visit: Payer: Medicare PPO

## 2022-02-18 DIAGNOSIS — I495 Sick sinus syndrome: Secondary | ICD-10-CM

## 2022-02-18 LAB — CUP PACEART REMOTE DEVICE CHECK
Date Time Interrogation Session: 20240206075346
Implantable Lead Connection Status: 753985
Implantable Lead Connection Status: 753985
Implantable Lead Implant Date: 20161003
Implantable Lead Implant Date: 20161003
Implantable Lead Location: 753859
Implantable Lead Location: 753860
Implantable Lead Model: 377
Implantable Lead Model: 377
Implantable Lead Serial Number: 49295355
Implantable Lead Serial Number: 49335002
Implantable Pulse Generator Implant Date: 20161003
Pulse Gen Model: 394929
Pulse Gen Serial Number: 68595075

## 2022-02-28 ENCOUNTER — Emergency Department (HOSPITAL_COMMUNITY)
Admission: EM | Admit: 2022-02-28 | Discharge: 2022-02-28 | Disposition: A | Discharge status: Home / Self Care | Payer: Medicare PPO | Attending: Emergency Medicine | Admitting: Emergency Medicine

## 2022-02-28 ENCOUNTER — Emergency Department (HOSPITAL_COMMUNITY): Payer: Medicare PPO

## 2022-02-28 ENCOUNTER — Other Ambulatory Visit: Payer: Self-pay

## 2022-02-28 DIAGNOSIS — S43082A Other subluxation of left shoulder joint, initial encounter: Secondary | ICD-10-CM | POA: Diagnosis not present

## 2022-02-28 DIAGNOSIS — Z7982 Long term (current) use of aspirin: Secondary | ICD-10-CM | POA: Diagnosis not present

## 2022-02-28 DIAGNOSIS — M25512 Pain in left shoulder: Secondary | ICD-10-CM | POA: Diagnosis present

## 2022-02-28 DIAGNOSIS — S43002A Unspecified subluxation of left shoulder joint, initial encounter: Secondary | ICD-10-CM

## 2022-02-28 DIAGNOSIS — X58XXXA Exposure to other specified factors, initial encounter: Secondary | ICD-10-CM | POA: Insufficient documentation

## 2022-02-28 MED ORDER — GABAPENTIN 100 MG PO CAPS
200.0000 mg | ORAL_CAPSULE | Freq: Every day | ORAL | 0 refills | Status: DC
Start: 2022-02-28 — End: 2022-06-30

## 2022-02-28 NOTE — Discharge Instructions (Addendum)
As we have discussed, your x-ray showed some subluxation of the joint as well as arthritis  I recommend you continue Tylenol for pain and take gabapentin as prescribed  You need to follow-up with an orthopedic doctor  Return to ER if you have worse shoulder pain or chest pain

## 2022-02-28 NOTE — ED Triage Notes (Signed)
Pt reports left shoulder pain that radiates down to left elbow

## 2022-02-28 NOTE — ED Provider Triage Note (Signed)
Emergency Medicine Provider Triage Evaluation Note  Kristine Sherman , a 89 y.o. female  was evaluated in triage.  Pt complains of left shoulder pain on and off for the last 2 weeks got worse today.  Pain is on the left shoulder going down to to her elbow.  Denies chest pain, shortness of breath, nausea vomiting, headache or dizziness.  No recent fall or injury to her extremities.  Review of Systems  Positive: As above Negative: As above  Physical Exam  BP 124/78 (BP Location: Right Arm)   Pulse 71   Temp 98.3 F (36.8 C) (Oral)   Resp 18   Ht 5' 5"$  (1.651 m)   Wt 58 kg   SpO2 100%   BMI 21.28 kg/m  Gen:   Awake, no distress   Resp:  Normal effort  MSK:   Moves extremities without difficulty  Other:    Medical Decision Making  Medically screening exam initiated at 5:50 PM.  Appropriate orders placed.  Aryianna Homes was informed that the remainder of the evaluation will be completed by another provider, this initial triage assessment does not replace that evaluation, and the importance of remaining in the ED until their evaluation is complete.    Rex Kras, [place] 03/01/22 0009

## 2022-02-28 NOTE — ED Provider Notes (Signed)
[place] EM[facility] DEPARTMENT AT [facility] Provider Note   CSN: [id number] Arrival date & time: 02/28/22  1705     History  Chief Complaint  Patient presents with   Shoulder Pain    Addelyn Applebaum is a 89 y.o. female here with shoulder pain. Patient has been having left shoulder pain. Patient states that she noticed left shoulder pain for several weeks. Patient denies trauma or injury. Patient denies chest pain.   The history is provided by the patient.       Home Medications Prior to Admission medications   Medication Sig Start Date End Date Taking? Authorizing Provider  acetaminophen (TYLENOL) 325 MG tablet Take 650 mg by mouth every 6 (six) hours as needed for headache.    [provider]  aspirin EC 81 MG tablet Take 81 mg by mouth at bedtime. Swallow whole.    [provider]  atorvastatin (LIPITOR) 40 MG tablet Take 40 mg by mouth every morning. 01/10/21   [provider]  brimonidine (ALPHAGAN) 0.2 % ophthalmic solution Place 1 drop into both eyes 3 (three) times daily. 03/11/21   [provider]  Cyanocobalamin (VITAMIN B-12 SL) Place 500 mcg under the tongue every morning. 1 spray - 500 mcg    [provider]  dorzolamide-timolol (COSOPT) 22.3-6.8 MG/ML ophthalmic solution Place 1 drop into both eyes 2 (two) times daily. 03/11/21   [provider]  gabapentin (NEURONTIN) 100 MG capsule Take 200 mg by mouth at bedtime.    [provider]  levothyroxine (SYNTHROID) 50 MCG tablet Take 1 tablet (50 mcg total) by mouth daily before breakfast. 01/27/22   Ngetich, Dinah C, NP  meclizine (ANTIVERT) 12.5 MG tablet Take 12.5 mg by mouth 3 (three) times daily as needed for dizziness.    [provider]  Netarsudil-Latanoprost (ROCKLATAN) 0.02-0.005 % SOLN Place 1 drop into both eyes at bedtime.    [provider]  Polyethyl Glycol-Propyl Glycol (SYSTANE PRESERVATIVE FREE OP) Place 1 drop into  both eyes 4 (four) times daily as needed (dry eyes). Systane Complete Preservative Free    [provider]      Allergies    Oxycodone-aspirin and Sulfa antibiotics    Review of Systems   Review of Systems  Musculoskeletal:        L shoulder pain   All other systems reviewed and are negative.   Physical Exam Updated Vital Signs BP (!) 149/87 (BP Location: Right Arm)   Pulse 65   Temp (!) 97.4 F (36.3 C) (Oral)   Resp 17   Ht 5' 5"$  (1.651 m)   Wt 57.6 kg   SpO2 98%   BMI 21.13 kg/m  Physical Exam Vitals and nursing note reviewed.  Constitutional:      Appearance: Normal appearance.  HENT:     Head: Normocephalic.     Nose: Nose normal.     Mouth/Throat:     Mouth: Mucous membranes are moist.  Eyes:     Extraocular Movements: Extraocular movements intact.     Pupils: Pupils are equal, round, and reactive to light.  Cardiovascular:     Rate and Rhythm: Normal rate and regular rhythm.     Pulses: Normal pulses.  Pulmonary:     Effort: Pulmonary effort is normal.     Breath sounds: Normal breath sounds.  Abdominal:     General: Abdomen is flat.     Palpations: Abdomen is soft.  Musculoskeletal:     Cervical  back: Normal range of motion and neck supple.     Comments: Nl ROM left shoulder no obvious deformity.  No tenderness in the humerus or forearm  Skin:    Capillary Refill: Capillary refill takes less than 2 seconds.  Neurological:     General: No focal deficit present.     Mental Status: She is alert and oriented to person, place, and time.  Psychiatric:        Mood and Affect: Mood normal.        Behavior: Behavior normal.     ED Results / Procedures / Treatments   Labs (all labs ordered are listed, but only abnormal results are displayed) Labs Reviewed - No data to display  EKG None  Radiology DG Shoulder Left  Result Date: 02/28/2022 CLINICAL DATA:  Left shoulder pain radiating to the elbow, no known injury, initial encounter EXAM:  LEFT SHOULDER - 2+ VIEW COMPARISON:  None Available. FINDINGS: Degenerative changes of the acromioclavicular and glenohumeral joints are seen. Slight inferior subluxation of the humeral head is noted with respect to the glenoid although no true dislocation is seen. No fracture is noted. Underlying bony thorax appears within normal limits. IMPRESSION: Degenerative changes with mild inferior subluxation. No true dislocation is seen. Electronically Signed   By: Inez Catalina M.D.   On: 02/28/2022 18:29    Procedures Procedures    Medications Ordered in ED Medications - No data to display  ED Course/ Medical Decision Making/ A&P                             Medical Decision Making Forever Fuente is a 89 y.o. female here presenting with left shoulder pain.  Patient has been having left shoulder pain for the last 2 weeks.  Patient has no trauma or injury.  X-rays reviewed and there is degenerative changes with subluxation.  Patient is able to range it with no pain.  Patient has a pacemaker and EKG obtained and was unremarkable.  Patient has no chest pain.  At this point, patient is stable for discharge. Will refer to ortho outpatient    Amount and/or Complexity of Data Reviewed Radiology: ordered and independent interpretation performed. Decision-making details documented in ED Course. ECG/medicine tests: ordered and independent interpretation performed. Decision-making details documented in ED Course.    Final Clinical Impression(s) / ED Diagnoses Final diagnoses:  None    Rx / DC Orders ED Discharge Orders     None         Drenda Freeze, MD 02/28/22 2035

## 2022-03-03 ENCOUNTER — Telehealth: Payer: Self-pay

## 2022-03-03 NOTE — Transitions of Care (Post Inpatient/ED Visit) (Signed)
   03/03/2022  Name: Kristine Sherman MRN: [id number] DOB: [birth year]  Today's TOC FU Call Status:  Completed 03/03/2022  Transition Care Management Follow-up Telephone Call  Spoke with patient  Items Reviewed:medications, physical state, upcoming appointments    Home Care and Equipment/Supplies:  Not needed  Functional Questionnaire: patient is independent     Folllow up appointments reviewed: Appointment made for Friday 03/07/2022 at [place]: [place]

## 2022-03-07 ENCOUNTER — Ambulatory Visit (INDEPENDENT_AMBULATORY_CARE_PROVIDER_SITE_OTHER): Payer: Medicare PPO | Admitting: Internal Medicine

## 2022-03-07 ENCOUNTER — Encounter: Payer: Self-pay | Admitting: Internal Medicine

## 2022-03-07 VITALS — BP 126/74 | HR 63 | Temp 97.7°F | Resp 17 | Ht 65.0 in | Wt 126.0 lb

## 2022-03-07 DIAGNOSIS — G8929 Other chronic pain: Secondary | ICD-10-CM

## 2022-03-07 DIAGNOSIS — N1832 Chronic kidney disease, stage 3b: Secondary | ICD-10-CM

## 2022-03-07 DIAGNOSIS — R002 Palpitations: Secondary | ICD-10-CM | POA: Diagnosis not present

## 2022-03-07 DIAGNOSIS — Z950 Presence of cardiac pacemaker: Secondary | ICD-10-CM | POA: Diagnosis not present

## 2022-03-07 DIAGNOSIS — D509 Iron deficiency anemia, unspecified: Secondary | ICD-10-CM

## 2022-03-07 DIAGNOSIS — M25512 Pain in left shoulder: Secondary | ICD-10-CM

## 2022-03-07 NOTE — Progress Notes (Signed)
Location:     Place of Service:     Provider:   Code Status:  Goals of Care:     03/07/2022   11:15 AM  Advanced Directives  Does Patient Have a Medical Advance Directive? Yes  Does patient want to make changes to medical advance directive? No - Patient declined  Copy of [place] in Chart? No - copy requested     Chief Complaint  Patient presents with   Transitions Of Care    Patient is being seen for Transition of Care. Some arm pain but it has went away.   Quality Metric Gaps    Discussed the need for Awv, Dexa Scan, and 2nd shingles shot NCIR verified    HPI: Patient is a 89 y.o. female seen today for an acute visit for Follow up from ED visit  Has h/o CKD,Anemia, s/p PPM in 2016 Hypertension, Dizziness Lives with her son  Linus Mako with her Cane  Left Shoulder pain Had Left shoulder pain for past Few weeks ED xray showed Mild Subluxation with DJD  Discharged with Tylenol Left shoulder feeling better with no pain Palpitations Has noticed recently Especially atnight  No Chest pain or SOB or nausea    Past Medical History:  Diagnosis Date   CKD (chronic kidney disease)    Glaucoma    Hyperlipidemia    Hypertension    Hypothyroidism    Neuropathy    Pacemaker    Seasonal allergies    Vertigo    Vitamin B12 deficiency     Past Surgical History:  Procedure Laterality Date   ABDOMINAL HYSTERECTOMY     DG GALL BLADDER      Allergies  Allergen Reactions   Oxycodone-Aspirin Other (See Comments)    Neurologic reaction - caused seizures, passed out (reaction to Percodan)     Sulfa Antibiotics Hives, Itching and Rash    Outpatient Encounter Medications as of 03/07/2022  Medication Sig   acetaminophen (TYLENOL) 325 MG tablet Take 650 mg by mouth every 6 (six) hours as needed for headache.   aspirin EC 81 MG tablet Take 81 mg by mouth at bedtime. Swallow whole.   atorvastatin (LIPITOR) 40 MG tablet Take 40 mg by mouth every morning.    brimonidine (ALPHAGAN) 0.2 % ophthalmic solution Place 1 drop into both eyes 3 (three) times daily.   Cyanocobalamin (VITAMIN B-12 SL) Place 500 mcg under the tongue every morning. 1 spray - 500 mcg   dorzolamide-timolol (COSOPT) 22.3-6.8 MG/ML ophthalmic solution Place 1 drop into both eyes 2 (two) times daily.   gabapentin (NEURONTIN) 100 MG capsule Take 2 capsules (200 mg total) by mouth at bedtime.   levothyroxine (SYNTHROID) 50 MCG tablet Take 1 tablet (50 mcg total) by mouth daily before breakfast.   meclizine (ANTIVERT) 12.5 MG tablet Take 12.5 mg by mouth 3 (three) times daily as needed for dizziness.   Netarsudil-Latanoprost (ROCKLATAN) 0.02-0.005 % SOLN Place 1 drop into both eyes at bedtime.   Polyethyl Glycol-Propyl Glycol (SYSTANE PRESERVATIVE FREE OP) Place 1 drop into both eyes 4 (four) times daily as needed (dry eyes). Systane Complete Preservative Free   No facility-administered encounter medications on file as of 03/07/2022.    Review of Systems:  Review of Systems  Constitutional:  Negative for activity change and appetite change.  HENT: Negative.    Respiratory:  Negative for cough and shortness of breath.   Cardiovascular:  Negative for leg swelling.  Gastrointestinal:  Positive for constipation.  Genitourinary: Negative.   Musculoskeletal:  Negative for arthralgias, gait problem and myalgias.  Skin: Negative.   Neurological:  Negative for dizziness and weakness.  Psychiatric/Behavioral:  Negative for confusion, dysphoric mood and sleep disturbance.     Health Maintenance  Topic Date Due   Medicare Annual Wellness (AWV)  Never done   DEXA SCAN  Never done   Zoster Vaccines- Shingrix (2 of 2) 03/17/2017   DTaP/Tdap/Td (2 - Td or Tdap) 07/30/2027   Pneumonia Vaccine 41+ Years old  Completed   INFLUENZA VACCINE  Completed   COVID-19 Vaccine  Completed   HPV VACCINES  Aged Out    Physical Exam: Vitals:   03/07/22 1115  BP: 126/74  Pulse: 63  Resp: 17   Temp: 97.7 F (36.5 C)  TempSrc: Temporal  SpO2: 91%  Weight: 126 lb (57.2 kg)  Height: '5\' 5"'$  (1.651 m)   Body mass index is 20.97 kg/m. Physical Exam Vitals reviewed.  Constitutional:      Appearance: Normal appearance.  HENT:     Head: Normocephalic.     Nose: Nose normal.     Mouth/Throat:     Mouth: Mucous membranes are moist.     Pharynx: Oropharynx is clear.  Eyes:     Pupils: Pupils are equal, round, and reactive to light.  Cardiovascular:     Rate and Rhythm: Normal rate and regular rhythm.     Pulses: Normal pulses.     Heart sounds: Normal heart sounds. No murmur heard. Pulmonary:     Effort: Pulmonary effort is normal.     Breath sounds: Normal breath sounds.  Abdominal:     General: Abdomen is flat. Bowel sounds are normal.     Palpations: Abdomen is soft.  Musculoskeletal:        General: No swelling.     Cervical back: Neck supple.     Comments: Left shoulder  No Restriction of movement No pain   Skin:    General: Skin is warm.  Neurological:     General: No focal deficit present.     Mental Status: She is alert and oriented to person, place, and time.  Psychiatric:        Mood and Affect: Mood normal.        Thought Content: Thought content normal.     Labs reviewed: Basic Metabolic Panel: Recent Labs    03/23/21 1701 03/23/21 2239 03/24/21 0613 01/09/22 0944  NA 134*  --  136 136  K 3.3*  --  3.7 4.9  CL 102  --  105 102  CO2 24  --  22 26  GLUCOSE 103*  --  79 82  BUN 40*  --  27* 12  CREATININE 1.72*  --  1.21* 1.21*  CALCIUM 8.5*  --  8.6* 9.3  MG  --  2.2  --   --   TSH  --   --   --  2.62   Liver Function Tests: Recent Labs    03/23/21 1701 01/09/22 0944  AST 18 18  ALT 14 12  ALKPHOS 70  --   BILITOT 0.4 0.7  PROT 6.6 7.0  ALBUMIN 3.3*  --    Recent Labs    03/23/21 1701  LIPASE 55*   No results for input(s): "AMMONIA" in the last 8760 hours. CBC: Recent Labs    03/23/21 1701 03/24/21 0757 01/09/22 0944   WBC 9.3 8.3 6.8  NEUTROABS 6.9  --  4,549  HGB  10.6* 9.8* 10.8*  HCT 34.0* 32.0* 35.4  MCV 71.4* 71.4* 72.0*  PLT 153 150 187   Lipid Panel: Recent Labs    01/09/22 0944  CHOL 152  HDL 50  LDLCALC 78  TRIG 143  CHOLHDL 3.0   No results found for: "HGBA1C"  Procedures since last visit: DG Shoulder Left  Result Date: 02/28/2022 CLINICAL DATA:  Left shoulder pain radiating to the elbow, no known injury, initial encounter EXAM: LEFT SHOULDER - 2+ VIEW COMPARISON:  None Available. FINDINGS: Degenerative changes of the acromioclavicular and glenohumeral joints are seen. Slight inferior subluxation of the humeral head is noted with respect to the glenoid although no true dislocation is seen. No fracture is noted. Underlying bony thorax appears within normal limits. IMPRESSION: Degenerative changes with mild inferior subluxation. No true dislocation is seen. Electronically Signed   By: Inez Catalina M.D.   On: 02/28/2022 18:29   CUP PACEART REMOTE DEVICE CHECK  Result Date: 02/18/2022 Scheduled remote reviewed. Normal device function.  Next remote 91 days- JJB   Assessment/Plan 1. Palpitation   - EKG 12-Lead Sinus Rhythm with First Degree AV block  Need to call cardiology for follow up  2. Chronic left shoulder pain Has Appointment with Ortho  3. Microcytic anemia Chronic Iron studies in 07/20 were good Repeat Iron studies,CBC,BMP and B 12 before her next visit with Dinah    Labs/tests ordered:  * No order type specified * Next appt:  06/30/2022

## 2022-03-07 NOTE — Patient Instructions (Signed)
Call Cardiology for Appointment and Follow up

## 2022-03-08 LAB — COMPLETE METABOLIC PANEL WITHOUT GFR
AG Ratio: 1.4 (calc) (ref 1.0–2.5)
ALT: 14 U/L (ref 6–29)
AST: 17 U/L (ref 10–35)
Albumin: 3.7 g/dL (ref 3.6–5.1)
Alkaline phosphatase (APISO): 88 U/L (ref 37–153)
BUN/Creatinine Ratio: 16 (calc) (ref 6–22)
BUN: 18 mg/dL (ref 7–25)
CO2: 27 mmol/L (ref 20–32)
Calcium: 8.8 mg/dL (ref 8.6–10.4)
Chloride: 105 mmol/L (ref 98–110)
Creat: 1.15 mg/dL — ABNORMAL HIGH (ref 0.60–0.95)
Globulin: 2.6 g/dL (ref 1.9–3.7)
Glucose, Bld: 81 mg/dL (ref 65–99)
Potassium: 3.9 mmol/L (ref 3.5–5.3)
Sodium: 140 mmol/L (ref 135–146)
Total Bilirubin: 0.7 mg/dL (ref 0.2–1.2)
Total Protein: 6.3 g/dL (ref 6.1–8.1)
eGFR: 45 mL/min/1.73m2 — ABNORMAL LOW

## 2022-03-08 LAB — CBC WITH DIFFERENTIAL/PLATELET
Absolute Monocytes: 340 cells/uL (ref 200–950)
Basophils Absolute: 41 cells/uL (ref 0–200)
Basophils Relative: 1 %
Eosinophils Absolute: 131 cells/uL (ref 15–500)
Eosinophils Relative: 3.2 %
HCT: 35 % (ref 35.0–45.0)
Hemoglobin: 10.4 g/dL — ABNORMAL LOW (ref 11.7–15.5)
Lymphs Abs: 1517 cells/uL (ref 850–3900)
MCH: 21.6 pg — ABNORMAL LOW (ref 27.0–33.0)
MCHC: 29.7 g/dL — ABNORMAL LOW (ref 32.0–36.0)
MCV: 72.6 fL — ABNORMAL LOW (ref 80.0–100.0)
Monocytes Relative: 8.3 %
Neutro Abs: 2071 cells/uL (ref 1500–7800)
Neutrophils Relative %: 50.5 %
Platelets: 178 10*3/uL (ref 140–400)
RBC: 4.82 10*6/uL (ref 3.80–5.10)
RDW: 15.7 % — ABNORMAL HIGH (ref 11.0–15.0)
Total Lymphocyte: 37 %
WBC: 4.1 10*3/uL (ref 3.8–10.8)

## 2022-03-08 LAB — IRON,TIBC AND FERRITIN PANEL
%SAT: 41 % (calc) (ref 16–45)
Ferritin: 102 ng/mL (ref 16–288)
Iron: 90 ug/dL (ref 45–160)
TIBC: 217 mcg/dL (calc) — ABNORMAL LOW (ref 250–450)

## 2022-03-08 LAB — VITAMIN B12: Vitamin B-12: 400 pg/mL (ref 200–1100)

## 2022-03-25 NOTE — Progress Notes (Signed)
Remote pacemaker transmission.   

## 2022-03-28 ENCOUNTER — Other Ambulatory Visit: Payer: Self-pay

## 2022-05-08 ENCOUNTER — Encounter: Payer: Self-pay | Admitting: Adult Health

## 2022-05-08 ENCOUNTER — Telehealth (INDEPENDENT_AMBULATORY_CARE_PROVIDER_SITE_OTHER): Payer: Medicare PPO | Admitting: Adult Health

## 2022-05-08 ENCOUNTER — Telehealth: Payer: Self-pay

## 2022-05-08 DIAGNOSIS — E039 Hypothyroidism, unspecified: Secondary | ICD-10-CM

## 2022-05-08 DIAGNOSIS — R531 Weakness: Secondary | ICD-10-CM | POA: Diagnosis not present

## 2022-05-08 NOTE — Telephone Encounter (Signed)
Patient son states she was in the yard doing some work and he went to check on her but she was laid out but responsive and wanted to know what was best. I have scheduled the patient with Monina today at 3:00pm

## 2022-05-08 NOTE — Telephone Encounter (Signed)
Noted  

## 2022-05-08 NOTE — Progress Notes (Signed)
This service is provided via telemedicine  No vital signs collected/recorded due to the encounter was a telemedicine visit.   Location of patient (ex: home, work):  Home   Patient consents to a video visit:  Yes, 05/08/2022  Location of the provider (ex: office, home):  [facility]  Name of any referring provider:  Ngetich, Donalee Citrin, NP   Names of all persons participating in the telemedicine service and their role in the encounter: Bethany B/CMA, [place]ville Hibberd C. Grayce Sessions, NP  and patient  Time spent on call:  11 minutes

## 2022-05-08 NOTE — Progress Notes (Signed)
I connected with  Kristine Sherman on 05/08/22 by a video enabled telemedicine application and verified that I am speaking with the correct person using two identifiers.   I discussed the limitations of evaluation and management by telemedicine. The patient expressed understanding and agreed to proceed.      DATE:  05/08/2022 MRN:  [id number]  BIRTHDAY: 28-Sep-1928   Contact Information     Name Relation Home Work Mobile   Guaman,William "Gabriel Rung" Son [phone]  [phone]   Adan Sis   [phone]        Code Status History     Date Active Date Inactive Code Status Order ID Comments User Context   03/23/2021 2134 03/24/2021 1823 DNR [id number]  Synetta Fail, MD ED   03/23/2021 2104 03/23/2021 2134 Full Code [id number]  Synetta Fail, MD ED    Questions for Most Recent Historical Code Status (Order [id number])     Question Answer   In the event of cardiac or respiratory ARREST Do not call a "code blue"   In the event of cardiac or respiratory ARREST Do not perform Intubation, CPR, defibrillation or ACLS   In the event of cardiac or respiratory ARREST Use medication by any route, position, wound care, and other measures to relive pain and suffering. May use oxygen, suction and manual treatment of airway obstruction as needed for comfort.           This is a video visit  Patient Location:  Home  Provider location  [facility]  Chief Complaint  Patient presents with   Acute Visit    Patient son was concerned after finding mom laid out in the yard     HISTORY OF PRESENT ILLNESS: This is a a 89 year old female who had a video visit with son beside her.  Son found patient outside sitting down on the yard. Patient stated that she she felt woozy when she went out briefly at around 11:00 am. She take Levothyroxine for hypothyroidism. TSH 2.62, normal level.   PAST MEDICAL HISTORY:  Past Medical History:  Diagnosis Date   CKD (chronic kidney disease)     Glaucoma    Hyperlipidemia    Hypertension    Hypothyroidism    Neuropathy    Pacemaker    Seasonal allergies    Vertigo    Vitamin B12 deficiency      CURRENT MEDICATIONS: Reviewed  Patient's Medications  New Prescriptions   No medications on file  Previous Medications   ACETAMINOPHEN (TYLENOL) 325 MG TABLET    Take 650 mg by mouth every 6 (six) hours as needed for headache.   ASPIRIN EC 81 MG TABLET    Take 81 mg by mouth at bedtime. Swallow whole.   ATORVASTATIN (LIPITOR) 40 MG TABLET    Take 40 mg by mouth every morning.   BRIMONIDINE (ALPHAGAN) 0.2 % OPHTHALMIC SOLUTION    Place 1 drop into both eyes 3 (three) times daily.   CYANOCOBALAMIN (VITAMIN B-12 SL)    Place 500 mcg under the tongue every morning. 1 spray - 500 mcg   DORZOLAMIDE-TIMOLOL (COSOPT) 22.3-6.8 MG/ML OPHTHALMIC SOLUTION    Place 1 drop into both eyes 2 (two) times daily.   GABAPENTIN (NEURONTIN) 100 MG CAPSULE    Take 2 capsules (200 mg total) by mouth at bedtime.   LEVOTHYROXINE (SYNTHROID) 50 MCG TABLET    Take 1 tablet (50 mcg total) by mouth daily before breakfast.   MECLIZINE (ANTIVERT) 12.5 MG TABLET  Take 12.5 mg by mouth 3 (three) times daily as needed for dizziness.   NETARSUDIL-LATANOPROST (ROCKLATAN) 0.02-0.005 % SOLN    Place 1 drop into both eyes at bedtime.   POLYETHYL GLYCOL-PROPYL GLYCOL (SYSTANE PRESERVATIVE FREE OP)    Place 1 drop into both eyes 4 (four) times daily as needed (dry eyes). Systane Complete Preservative Free  Modified Medications   No medications on file  Discontinued Medications   No medications on file     Allergies  Allergen Reactions   Oxycodone-Aspirin Other (See Comments)    Neurologic reaction - caused seizures, passed out (reaction to Percodan)     Sulfa Antibiotics Hives, Itching and Rash     REVIEW OF SYSTEMS:  GENERAL: had weakness SKIN: Denies rash, itching, wounds, ulcer sores, or nail abnormality EYES: Denies change in vision, dry eyes, eye pain,  itching or discharge EARS: Denies change in hearing, ringing in ears, or earache NOSE: Denies nasal congestion or epistaxis MOUTH and THROAT: Denies oral discomfort, gingival pain or bleeding, pain from teeth or hoarseness   RESPIRATORY: no cough, SOB, DOE, wheezing, hemoptysis CARDIAC: no chest pain, edema or palpitations GI: constipationng GU: Denies dysuria, frequency, hematuria, incontinence, or discharge MUSCULOSKELETAL:walks with a cane NEUROLOGICAL: Denies dizziness, syncope, numbness, or headache PSYCHIATRIC: Denies feeling of depression or anxiety. No report of hallucinations, insomnia, paranoia, or agitation    LABS/RADIOLOGY: Labs reviewed: Basic Metabolic Panel: Recent Labs    01/09/22 0944 03/07/22 1207  NA 136 140  K 4.9 3.9  CL 102 105  CO2 26 27  GLUCOSE 82 81  BUN 12 18  CREATININE 1.21* 1.15*  CALCIUM 9.3 8.8   Liver Function Tests: Recent Labs    01/09/22 0944 03/07/22 1207  AST 18 17  ALT 12 14  BILITOT 0.7 0.7  PROT 7.0 6.3   No results for input(s): "LIPASE", "AMYLASE" in the last 8760 hours. No results for input(s): "AMMONIA" in the last 8760 hours. CBC: Recent Labs    01/09/22 0944 03/07/22 1207  WBC 6.8 4.1  NEUTROABS 4,549 2,071  HGB 10.8* 10.4*  HCT 35.4 35.0  MCV 72.0* 72.6*  PLT 187 178   A1C: Invalid input(s): "A1C" Lipid Panel: Recent Labs    01/09/22 0944  HDL 50   Cardiac Enzymes: No results for input(s): "CKTOTAL", "CKMB", "CKMBINDEX", "TROPONINI" in the last 8760 hours. BNP: Invalid input(s): "POCBNP" CBG: No results for input(s): "GLUCAP" in the last 8760 hours.    No results found.  ASSESSMENT/PLAN:  1. Generalized weakness -  no syncope -  possibly due to heat outside and dehydration -  instructed to drink fluid for hydration - Basic Metabolic Panel with eGFR; Future - CBC With Differential/Platelet; Future  2. Hypothyroidism (acquired) Lab Results  Component Value Date   TSH 2.62 01/09/2022    -  continue Levothyroxine      Time spent on non face to face visit:   11 minutes video visit  The patient gave consent to this video visit. Explained to the patient the risk and privacy issue that was involved with this video call.   The patient was advised to call back and ask for an in-person evaluation if the symptoms worsen or if the condition fails to improve.   Kenard Gower, NP [employer] [phone]

## 2022-05-09 ENCOUNTER — Other Ambulatory Visit: Payer: Medicare PPO

## 2022-05-09 DIAGNOSIS — R531 Weakness: Secondary | ICD-10-CM

## 2022-05-10 LAB — BASIC METABOLIC PANEL WITH GFR
BUN/Creatinine Ratio: 14 (calc) (ref 6–22)
BUN: 17 mg/dL (ref 7–25)
CO2: 27 mmol/L (ref 20–32)
Calcium: 8.9 mg/dL (ref 8.6–10.4)
Chloride: 102 mmol/L (ref 98–110)
Creat: 1.25 mg/dL — ABNORMAL HIGH (ref 0.60–0.95)
Glucose, Bld: 89 mg/dL (ref 65–99)
Potassium: 4.6 mmol/L (ref 3.5–5.3)
Sodium: 136 mmol/L (ref 135–146)
eGFR: 41 mL/min/{1.73_m2} — ABNORMAL LOW (ref >=60)

## 2022-05-10 LAB — CBC WITH DIFFERENTIAL/PLATELET
Absolute Monocytes: 350 cells/uL (ref 200–950)
Basophils Absolute: 32 cells/uL (ref 0–200)
Basophils Relative: 0.6 %
Eosinophils Absolute: 90 cells/uL (ref 15–500)
Eosinophils Relative: 1.7 %
HCT: 35.9 % (ref 35.0–45.0)
Hemoglobin: 10.8 g/dL — ABNORMAL LOW (ref 11.7–15.5)
Lymphs Abs: 1632 cells/uL (ref 850–3900)
MCH: 21.7 pg — ABNORMAL LOW (ref 27.0–33.0)
MCHC: 30.1 g/dL — ABNORMAL LOW (ref 32.0–36.0)
MCV: 72.1 fL — ABNORMAL LOW (ref 80.0–100.0)
Monocytes Relative: 6.6 %
Neutro Abs: 3196 cells/uL (ref 1500–7800)
Neutrophils Relative %: 60.3 %
Platelets: 168 10*3/uL (ref 140–400)
RBC: 4.98 10*6/uL (ref 3.80–5.10)
RDW: 15.3 % — ABNORMAL HIGH (ref 11.0–15.0)
Total Lymphocyte: 30.8 %
WBC: 5.3 10*3/uL (ref 3.8–10.8)

## 2022-05-13 NOTE — Progress Notes (Signed)
-    hgb 10.8, improved from 10.4 (03/07/22 -  kidney function and electrolytes stable, stay hydrated and avoid prolonged exposure to sun when the weather is warm

## 2022-05-20 ENCOUNTER — Ambulatory Visit (INDEPENDENT_AMBULATORY_CARE_PROVIDER_SITE_OTHER): Payer: Medicare PPO

## 2022-05-20 DIAGNOSIS — I495 Sick sinus syndrome: Secondary | ICD-10-CM | POA: Diagnosis not present

## 2022-05-20 LAB — CUP PACEART REMOTE DEVICE CHECK
Battery Remaining Percentage: 45 %
Brady Statistic RA Percent Paced: 97 %
Brady Statistic RV Percent Paced: 0 %
Date Time Interrogation Session: 20240507071604
Implantable Lead Connection Status: 753985
Implantable Lead Connection Status: 753985
Implantable Lead Implant Date: 20161003
Implantable Lead Implant Date: 20161003
Implantable Lead Location: 753859
Implantable Lead Location: 753860
Implantable Lead Model: 377
Implantable Lead Model: 377
Implantable Lead Serial Number: 49295355
Implantable Lead Serial Number: 49335002
Implantable Pulse Generator Implant Date: 20161003
Lead Channel Impedance Value: 488 Ohm
Lead Channel Impedance Value: 488 Ohm
Lead Channel Pacing Threshold Amplitude: 0.7 V
Lead Channel Pacing Threshold Pulse Width: 0.4 ms
Lead Channel Sensing Intrinsic Amplitude: 1.4 mV
Lead Channel Sensing Intrinsic Amplitude: 5 mV
Lead Channel Setting Pacing Amplitude: 2.4 V
Lead Channel Setting Pacing Amplitude: 2.4 V
Lead Channel Setting Pacing Pulse Width: 0.4 ms
Pulse Gen Model: 394929
Pulse Gen Serial Number: 68595075

## 2022-06-11 NOTE — Progress Notes (Signed)
Remote pacemaker transmission.   

## 2022-06-30 ENCOUNTER — Ambulatory Visit: Payer: Medicare PPO | Admitting: Family

## 2022-06-30 ENCOUNTER — Encounter: Payer: Self-pay | Admitting: Family

## 2022-06-30 ENCOUNTER — Encounter: Payer: Self-pay | Admitting: Cardiology

## 2022-06-30 VITALS — BP 120/72 | HR 62 | Temp 97.0°F | Resp 20 | Ht 65.0 in | Wt 125.6 lb

## 2022-06-30 DIAGNOSIS — N1832 Chronic kidney disease, stage 3b: Secondary | ICD-10-CM

## 2022-06-30 DIAGNOSIS — I10 Essential (primary) hypertension: Secondary | ICD-10-CM | POA: Diagnosis not present

## 2022-06-30 DIAGNOSIS — E785 Hyperlipidemia, unspecified: Secondary | ICD-10-CM

## 2022-06-30 DIAGNOSIS — G609 Hereditary and idiopathic neuropathy, unspecified: Secondary | ICD-10-CM

## 2022-06-30 DIAGNOSIS — E039 Hypothyroidism, unspecified: Secondary | ICD-10-CM | POA: Diagnosis not present

## 2022-06-30 DIAGNOSIS — D509 Iron deficiency anemia, unspecified: Secondary | ICD-10-CM | POA: Diagnosis not present

## 2022-06-30 MED ORDER — GABAPENTIN 100 MG PO CAPS: 200.0000 mg | ORAL_CAPSULE | Freq: Every day | ORAL | 0 refills | Status: DC

## 2022-06-30 NOTE — Progress Notes (Signed)
Provider: Richarda Blade FNP-C   Isaiha Asare, Donalee Citrin, NP  Patient Care Team: Hurbert Duran, Donalee Citrin, NP as PCP - General (Family Medicine) Lanier Prude, MD as PCP - Electrophysiology (Cardiology)  Extended Emergency Contact Information Primary Emergency Contact: Arteaga,William "Joe" Address: [address]          [place], [place] [address] [place] Home Phone: [phone] Mobile Phone: [phone] Relation: Son Secondary Emergency Contact: Hayes,Josie Mobile Phone: [phone] Relation: Sister Preferred language: English Interpreter needed? No  Code Status:  Full Code  Goals of care: Advanced Directive information    03/07/2022   11:15 AM  Advanced Directives  Does Patient Have a Medical Advance Directive? Yes  Does patient want to make changes to medical advance directive? No - Patient declined  Copy of Healthcare Power of Attorney in Chart? No - copy requested     Chief Complaint  Patient presents with   Medical Management of Chronic Issues    Patient presents today for a 6 month follow-up   Quality Metric Gaps    AWV, DEXA scan, zoster#2    HPI:  Pt is a 89 y.o. female seen today for 6 months follow up for medical management of chronic diseases.    Hypertension - states B/p at home has been within normal range.some palpitation at times lasting one minute or two.she denies any headache,vision changes,fatigue,chest tightness,chest pain or shortness of breath.      Hyperlipidemia - on atorvastatin states tries to eat healthy.chicken and fish mostly.Exercises 2-3 times around the block.    Anemia - previous hgb 10.8 states some days feels tired does not feel like getting up or doing anything but others days has energy. No blood in the stool or dark stool.   Due for osteoporosis screening.No fall episode.    Immunization up to date except had shingles vaccine but 2nd not document.  Past Medical History:  Diagnosis Date   CKD (chronic kidney  disease)    Glaucoma    Hyperlipidemia    Hypertension    Hypothyroidism    Neuropathy    Pacemaker    Seasonal allergies    Vertigo    Vitamin B12 deficiency    Past Surgical History:  Procedure Laterality Date   ABDOMINAL HYSTERECTOMY     DG GALL BLADDER      Allergies  Allergen Reactions   Oxycodone-Aspirin Other (See Comments)    Neurologic reaction - caused seizures, passed out (reaction to Percodan)     Sulfa Antibiotics Hives, Itching and Rash    Allergies as of 06/30/2022       Reactions   Oxycodone-aspirin Other (See Comments)   Neurologic reaction - caused seizures, passed out (reaction to Percodan)   Sulfa Antibiotics Hives, Itching, Rash        Medication List        Accurate as of June 30, 2022  2:55 PM. If you have any questions, ask your nurse or doctor.          acetaminophen 325 MG tablet Commonly known as: TYLENOL Take 650 mg by mouth every 6 (six) hours as needed for headache.   aspirin EC 81 MG tablet Take 81 mg by mouth at bedtime. Swallow whole.   atorvastatin 40 MG tablet Commonly known as: LIPITOR Take 40 mg by mouth every morning.   brimonidine 0.2 % ophthalmic solution Commonly known as: ALPHAGAN Place 1 drop into both eyes 3 (three) times daily.   dorzolamide-timolol 2-0.5 %  ophthalmic solution Commonly known as: COSOPT Place 1 drop into both eyes 2 (two) times daily.   gabapentin 100 MG capsule Commonly known as: NEURONTIN Take 2 capsules (200 mg total) by mouth at bedtime.   levothyroxine 50 MCG tablet Commonly known as: SYNTHROID Take 1 tablet (50 mcg total) by mouth daily before breakfast.   meclizine 12.5 MG tablet Commonly known as: ANTIVERT Take 12.5 mg by mouth 3 (three) times daily as needed for dizziness.   Rocklatan 0.02-0.005 % Soln Generic drug: Netarsudil-Latanoprost Place 1 drop into both eyes at bedtime.   SYSTANE PRESERVATIVE FREE OP Place 1 drop into both eyes 4 (four) times daily as needed  (dry eyes). Systane Complete Preservative Free   VITAMIN B-12 SL Place 500 mcg under the tongue every morning. 1 spray - 500 mcg        Review of Systems  Constitutional:  Negative for appetite change, chills, fatigue, fever and unexpected weight change.  HENT:  Positive for hearing loss. Negative for congestion, dental problem, ear discharge, ear pain, facial swelling, nosebleeds, postnasal drip, rhinorrhea, sinus pressure, sinus pain, sneezing, sore throat, tinnitus and trouble swallowing.   Eyes:  Negative for pain, discharge, redness, itching and visual disturbance.  Respiratory:  Negative for cough, chest tightness, shortness of breath and wheezing.   Cardiovascular:  Negative for chest pain, palpitations and leg swelling.  Gastrointestinal:  Negative for abdominal distention, abdominal pain, blood in stool, constipation, diarrhea, nausea and vomiting.  Endocrine: Negative for cold intolerance, heat intolerance, polydipsia, polyphagia and polyuria.  Genitourinary:  Negative for difficulty urinating, dysuria, flank pain, frequency and urgency.  Musculoskeletal:  Positive for gait problem. Negative for arthralgias, back pain, joint swelling, myalgias, neck pain and neck stiffness.  Skin:  Negative for color change, pallor, rash and wound.  Neurological:  Negative for dizziness, syncope, speech difficulty, weakness, light-headedness, numbness and headaches.  Hematological:  Does not bruise/bleed easily.  Psychiatric/Behavioral:  Negative for agitation, behavioral problems, confusion, hallucinations, self-injury, sleep disturbance and suicidal ideas. The patient is not nervous/anxious.     Immunization History  Administered Date(s) Administered   COVID-19, mRNA, vaccine(Comirnaty)12 years and older 11/18/2021   Fluad Quad(high Dose 65+) 10/18/2021   Influenza Split 10/19/2016, 10/27/2016, 10/22/2017, 09/29/2018   Influenza, High Dose Seasonal PF 09/11/2020   Influenza-Unspecified  10/27/2016   PFIZER(Purple Top)SARS-COV-2 Vaccination 01/19/2019, 02/07/2019, 10/11/2019   Pneumococcal Conjugate-13 10/22/2016   Pneumococcal Polysaccharide-23 02/01/2018   Tdap 07/29/2017   Zoster Recombinat (Shingrix) 01/20/2017   Pertinent  Health Maintenance Due  Topic Date Due   DEXA SCAN  Never done   INFLUENZA VACCINE  08/14/2022      03/24/2021   12:00 PM 01/09/2022    9:00 AM 03/07/2022   11:14 AM 05/08/2022    3:16 PM 06/30/2022    2:19 PM  Fall Risk  Falls in the past year?  0 0 0 0  Was there an injury with Fall?  0 0 0 0  Fall Risk Category Calculator  0 0 0 0  Fall Risk Category (Retired)  Low     (RETIRED) Patient Fall Risk Level High fall risk Low fall risk     Patient at Risk for Falls Due to  No Fall Risks Impaired balance/gait;No Fall Risks No Fall Risks No Fall Risks  Fall risk Follow up  Falls evaluation completed  Falls evaluation completed Falls evaluation completed   Functional Status Survey:    Vitals:   06/30/22 1418  BP: 120/72  Pulse: 62  Resp: 20  Temp: (!) 97 F (36.1 C)  SpO2: 98%  Weight: 125 lb 9.6 oz (57 kg)  Height: 5\' 5"  (1.651 m)   Body mass index is 20.9 kg/m. Physical Exam Vitals reviewed.  Constitutional:      General: She is not in acute distress.    Appearance: Normal appearance. She is normal weight. She is not ill-appearing or diaphoretic.  HENT:     Head: Normocephalic.     Right Ear: Tympanic membrane, ear canal and external ear normal. There is no impacted cerumen.     Left Ear: Tympanic membrane, ear canal and external ear normal. There is no impacted cerumen.     Nose: Nose normal. No congestion or rhinorrhea.     Mouth/Throat:     Mouth: Mucous membranes are moist.     Pharynx: Oropharynx is clear. No oropharyngeal exudate or posterior oropharyngeal erythema.  Eyes:     General: No scleral icterus.       Right eye: No discharge.        Left eye: No discharge.     Extraocular Movements: Extraocular movements  intact.     Conjunctiva/sclera: Conjunctivae normal.     Pupils: Pupils are equal, round, and reactive to light.  Neck:     Vascular: No carotid bruit.  Cardiovascular:     Rate and Rhythm: Normal rate and regular rhythm.     Pulses: Normal pulses.     Heart sounds: Normal heart sounds. No murmur heard.    No friction rub. No gallop.  Pulmonary:     Effort: Pulmonary effort is normal. No respiratory distress.     Breath sounds: Normal breath sounds. No wheezing, rhonchi or rales.  Chest:     Chest wall: No tenderness.  Abdominal:     General: Bowel sounds are normal. There is no distension.     Palpations: Abdomen is soft. There is no mass.     Tenderness: There is no abdominal tenderness. There is no right CVA tenderness, left CVA tenderness, guarding or rebound.  Musculoskeletal:        General: No swelling or tenderness. Normal range of motion.     Cervical back: Normal range of motion. No rigidity or tenderness.     Right lower leg: No edema.     Left lower leg: No edema.  Lymphadenopathy:     Cervical: No cervical adenopathy.  Skin:    General: Skin is warm and dry.     Coloration: Skin is not pale.     Findings: No bruising, erythema, lesion or rash.  Neurological:     Mental Status: She is alert and oriented to person, place, and time.     Cranial Nerves: No cranial nerve deficit.     Sensory: No sensory deficit.     Motor: No weakness.     Coordination: Coordination normal.     Gait: Gait abnormal.  Psychiatric:        Mood and Affect: Mood normal.        Speech: Speech normal.        Behavior: Behavior normal.        Thought Content: Thought content normal.        Judgment: Judgment normal.     Labs reviewed: Recent Labs    01/09/22 0944 03/07/22 1207 05/09/22 0907  NA 136 140 136  K 4.9 3.9 4.6  CL 102 105 102  CO2 26 27 27   GLUCOSE 82 81 89  BUN 12 18 17   CREATININE 1.21* 1.15* 1.25*  CALCIUM 9.3 8.8 8.9   Recent Labs    01/09/22 0944  03/07/22 1207  AST 18 17  ALT 12 14  BILITOT 0.7 0.7  PROT 7.0 6.3   Recent Labs    01/09/22 0944 03/07/22 1207 05/09/22 0907  WBC 6.8 4.1 5.3  NEUTROABS 4,549 2,071 3,196  HGB 10.8* 10.4* 10.8*  HCT 35.4 35.0 35.9  MCV 72.0* 72.6* 72.1*  PLT 187 178 168   Lab Results  Component Value Date   TSH 2.62 01/09/2022   No results found for: "HGBA1C" Lab Results  Component Value Date   CHOL 152 01/09/2022   HDL 50 01/09/2022   LDLCALC 78 01/09/2022   TRIG 143 01/09/2022   CHOLHDL 3.0 01/09/2022    Significant Diagnostic Results in last 30 days:  No results found.  Assessment/Plan 1. Essential hypertension B/p well controlled  - dietary modification and exercise advised - COMPLETE METABOLIC PANEL WITH GFR - CBC with Differential/Platelet  2. Stage 3b chronic kidney disease (HCC) CR 1.25 at baseline  - Will continue to avoid Nephrotoxins and dose all other medication for renal clearance    3. Microcytic anemia Hgb stable possible multifactorial due to chronic kidney disease  - continue to monitor   4. Hypothyroidism (acquired) Lab Results  Component Value Date   TSH 2.62 01/09/2022  - continue levothyroxine 50 mcg tablet daily on empty stomach  - TSH  5. Hyperlipidemia LDL goal <100 Previous LDL at goal  - continue with dietary modification and exercise  - Lipid panel  6. Idiopathic peripheral neuropathy Continue on Gabapentin  - gabapentin (NEURONTIN) 100 MG capsule; Take 2 capsules (200 mg total) by mouth at bedtime.  Dispense: 30 capsule; Refill: 0  Family/ staff Communication: Reviewed plan of care with patient verbalized understanding  Labs/tests ordered:  - COMPLETE METABOLIC PANEL WITH GFR - CBC with Differential/Platelet - Lipid panel - TSH  Next Appointment : Return in about 6 months (around 12/30/2022) for medical mangement of chronic issues.Caesar Bookman, NP

## 2022-07-01 LAB — CBC WITH DIFFERENTIAL/PLATELET
Absolute Monocytes: 360 cells/uL (ref 200–950)
Basophils Absolute: 18 cells/uL (ref 0–200)
Basophils Relative: 0.4 %
Eosinophils Absolute: 59 cells/uL (ref 15–500)
Eosinophils Relative: 1.3 %
HCT: 37.2 % (ref 35.0–45.0)
Hemoglobin: 11.2 g/dL — ABNORMAL LOW (ref 11.7–15.5)
Lymphs Abs: 1697 cells/uL (ref 850–3900)
MCH: 22 pg — ABNORMAL LOW (ref 27.0–33.0)
MCHC: 30.1 g/dL — ABNORMAL LOW (ref 32.0–36.0)
MCV: 72.9 fL — ABNORMAL LOW (ref 80.0–100.0)
Monocytes Relative: 8 %
Neutro Abs: 2367 cells/uL (ref 1500–7800)
Neutrophils Relative %: 52.6 %
Platelets: 192 10*3/uL (ref 140–400)
RBC: 5.1 10*6/uL (ref 3.80–5.10)
RDW: 15.9 % — ABNORMAL HIGH (ref 11.0–15.0)
Total Lymphocyte: 37.7 %
WBC: 4.5 10*3/uL (ref 3.8–10.8)

## 2022-07-01 LAB — COMPLETE METABOLIC PANEL WITH GFR
AG Ratio: 1.4 (calc) (ref 1.0–2.5)
ALT: 19 U/L (ref 6–29)
AST: 19 U/L (ref 10–35)
Albumin: 3.9 g/dL (ref 3.6–5.1)
Alkaline phosphatase (APISO): 103 U/L (ref 37–153)
BUN/Creatinine Ratio: 19 (calc) (ref 6–22)
BUN: 22 mg/dL (ref 7–25)
CO2: 30 mmol/L (ref 20–32)
Calcium: 9.2 mg/dL (ref 8.6–10.4)
Chloride: 104 mmol/L (ref 98–110)
Creat: 1.15 mg/dL — ABNORMAL HIGH (ref 0.60–0.95)
Globulin: 2.7 g/dL (calc) (ref 1.9–3.7)
Glucose, Bld: 78 mg/dL (ref 65–139)
Potassium: 4.1 mmol/L (ref 3.5–5.3)
Sodium: 138 mmol/L (ref 135–146)
Total Bilirubin: 0.6 mg/dL (ref 0.2–1.2)
Total Protein: 6.6 g/dL (ref 6.1–8.1)
eGFR: 45 mL/min/{1.73_m2} — ABNORMAL LOW (ref >=60)

## 2022-07-01 LAB — TSH: TSH: 0.89 mIU/L (ref 0.40–4.50)

## 2022-07-01 LAB — LIPID PANEL
Cholesterol: 161 mg/dL (ref <200)
HDL: 55 mg/dL (ref >=50)
LDL Cholesterol (Calc): 83 mg/dL (calc)
Non-HDL Cholesterol (Calc): 106 mg/dL (calc) (ref <130)
Total CHOL/HDL Ratio: 2.9 (calc) (ref <5.0)
Triglycerides: 130 mg/dL (ref <150)

## 2022-07-03 ENCOUNTER — Telehealth: Payer: Self-pay | Admitting: Family

## 2022-07-03 NOTE — Telephone Encounter (Signed)
Patient is requesting a referral to dermatology. She had skin cancer diagnosed on her leg several years ago and would like to see a dermatologist to check on this issue.

## 2022-07-04 NOTE — Telephone Encounter (Signed)
Called patient and left message to return call to schedule appointment. 

## 2022-07-04 NOTE — Telephone Encounter (Signed)
Patient needs to be evaluated in the office for documentation needed for referral.

## 2022-07-04 NOTE — Telephone Encounter (Signed)
Patient return call and was advised that she will need appointment and she going to wait until her video visit on 07/22/22 with Dinah Ngetich,NP.

## 2022-07-22 ENCOUNTER — Ambulatory Visit (INDEPENDENT_AMBULATORY_CARE_PROVIDER_SITE_OTHER): Payer: Medicare PPO | Admitting: Family

## 2022-07-22 ENCOUNTER — Encounter: Payer: Self-pay | Admitting: Family

## 2022-07-22 ENCOUNTER — Other Ambulatory Visit: Payer: Self-pay | Admitting: Family

## 2022-07-22 DIAGNOSIS — Z0000 Encounter for general adult medical examination without abnormal findings: Secondary | ICD-10-CM | POA: Diagnosis not present

## 2022-07-22 DIAGNOSIS — G609 Hereditary and idiopathic neuropathy, unspecified: Secondary | ICD-10-CM

## 2022-07-22 NOTE — Progress Notes (Unsigned)
This service is provided via telemedicine  No vital signs collected/recorded due to the encounter was a telemedicine visit.   Location of patient (ex: home, work):  home  Patient consents to a telephone visit:  yes  Location of the provider (ex: office, home): [facility]  Names of all persons participating in the telemedicine service and their role in the encounter:  Anayah Cadmus, Juanjose Mojica, Carilyn Goodpasture, NP, Guss Bunde CCMA,CMAA   Time spent on call:  10    Subjective:   Kristine Sherman is a 89 y.o. female who presents for Medicare Annual (Subsequent) preventive examination.  Visit Complete: In person  Patient Medicare AWV questionnaire was completed by the patient on 07/22/2022; I have confirmed that all information answered by patient is correct and no changes since this date.  Review of Systems     Cardiac Risk Factors include: advanced age (>50men, >51 women)     Objective:    Today's Vitals   07/22/22 1546  PainSc: 0-No pain   There is no height or weight on file to calculate BMI.     07/22/2022    3:13 PM 03/07/2022   11:15 AM 02/28/2022    5:43 PM 02/28/2022    5:42 PM 01/09/2022    9:00 AM 03/24/2021   12:00 PM 03/23/2021   10:00 PM  Advanced Directives  Does Patient Have a Medical Advance Directive? Yes Yes No No Yes No No  Type of Estate agent of [place];Living will;Out of facility DNR (pink MOST or yellow form)    Healthcare Power of [place];Living will    Does patient want to make changes to medical advance directive?  No - Patient declined   No - Patient declined    Copy of Healthcare Power of Attorney in Chart? No - copy requested No - copy requested   No - copy requested    Would patient like information on creating a medical advance directive?      Yes (Inpatient - patient requests chaplain consult to create a medical advance directive) No - Patient declined    Current Medications (verified) Outpatient  Encounter Medications as of 07/22/2022  Medication Sig   acetaminophen (TYLENOL) 325 MG tablet Take 650 mg by mouth every 6 (six) hours as needed for headache.   aspirin EC 81 MG tablet Take 81 mg by mouth at bedtime. Swallow whole.   atorvastatin (LIPITOR) 40 MG tablet Take 40 mg by mouth every morning.   brimonidine (ALPHAGAN) 0.2 % ophthalmic solution Place 1 drop into both eyes 3 (three) times daily.   Cyanocobalamin (VITAMIN B-12 SL) Place 500 mcg under the tongue every morning. 1 spray - 500 mcg   dorzolamide-timolol (COSOPT) 22.3-6.8 MG/ML ophthalmic solution Place 1 drop into both eyes 2 (two) times daily.   gabapentin (NEURONTIN) 100 MG capsule TAKE 2 CAPSULES BY MOUTH AT BEDTIME.   levothyroxine (SYNTHROID) 50 MCG tablet Take 1 tablet (50 mcg total) by mouth daily before breakfast.   meclizine (ANTIVERT) 12.5 MG tablet Take 12.5 mg by mouth 3 (three) times daily as needed for dizziness.   Netarsudil-Latanoprost (ROCKLATAN) 0.02-0.005 % SOLN Place 1 drop into both eyes at bedtime.   Polyethyl Glycol-Propyl Glycol (SYSTANE PRESERVATIVE FREE OP) Place 1 drop into both eyes 4 (four) times daily as needed (dry eyes). Systane Complete Preservative Free   No facility-administered encounter medications on file as of 07/22/2022.    Allergies (verified) Oxycodone-aspirin and Sulfa antibiotics   History: Past Medical History:  Diagnosis Date   CKD (chronic kidney disease)    Glaucoma    Hyperlipidemia    Hypertension    Hypothyroidism    Neuropathy    Pacemaker    Seasonal allergies    Vertigo    Vitamin B12 deficiency    Past Surgical History:  Procedure Laterality Date   ABDOMINAL HYSTERECTOMY     DG GALL BLADDER     Family History  Problem Relation Age of Onset   Rheum arthritis Mother    Breast cancer Sister    Social History   Socioeconomic History   Marital status: Divorced    Spouse name: Not on file   Number of children: Not on file   Years of education: Not on  file   Highest education level: Master's degree (e.g., MA, MS, MEng, MEd, MSW, MBA)  Occupational History   Not on file  Tobacco Use   Smoking status: Never   Smokeless tobacco: Never  Substance and Sexual Activity   Alcohol use: Yes    Alcohol/week: 1.0 standard drink of alcohol    Types: 1 Glasses of wine per week    Comment: monthly   Drug use: Never   Sexual activity: Not on file  Other Topics Concern   Not on file  Social History Narrative   Not on file   Social Determinants of Health   Financial Resource Strain: Low Risk  (06/26/2022)   Overall Financial Resource Strain (CARDIA)    Difficulty of Paying Living Expenses: Not hard at all  Food Insecurity: Unknown (06/26/2022)   Hunger Vital Sign    Worried About Running Out of Food in the Last Year: Never true    Ran Out of Food in the Last Year: Not on file  Transportation Needs: Unknown (06/26/2022)   PRAPARE - Administrator, Civil Service (Medical): No    Lack of Transportation (Non-Medical): Not on file  Physical Activity: Insufficiently Active (06/26/2022)   Exercise Vital Sign    Days of Exercise per Week: 3 days    Minutes of Exercise per Session: 30 min  Stress: No Stress Concern Present (06/26/2022)   [employer] of Occupational Health - Occupational Stress Questionnaire    Feeling of Stress : Not at all  Social Connections: Unknown (06/26/2022)   Social Connection and Isolation Panel [NHANES]    Frequency of Communication with Friends and Family: More than three times a week    Frequency of Social Gatherings with Friends and Family: Patient declined    Attends Religious Services: Patient declined    Database administrator or Organizations: Patient declined    Attends Engineer, structural: Not on file    Marital Status: Divorced    Tobacco Counseling Counseling given: Not Answered   Clinical Intake:  Pre-visit preparation completed: No  Pain : 0-10 Pain Score: 0-No pain Pain  Type: Chronic pain Pain Location: Back Pain Orientation: Lower Pain Radiating Towards: no Pain Descriptors / Indicators: Aching Pain Onset: Other (comment) (several years) Pain Frequency: Intermittent Pain Relieving Factors: Tylenol and exercise Effect of Pain on Daily Activities: walking  Pain Relieving Factors: Tylenol and exercise  BMI - recorded: 20.9 Nutritional Status: BMI of 19-24  Normal Nutritional Risks: None Diabetes: No  How often do you need to have someone help you when you read instructions, pamphlets, or other written materials from your doctor or pharmacy?: 3 - Sometimes (if print is small) What is the last grade level you completed in  school?: Masters Degree  Interpreter Needed?: No      Activities of Daily Living    07/22/2022    3:53 PM 07/20/2022   10:12 PM  In your present state of health, do you have any difficulty performing the following activities:  Hearing? 1 1  Comment wears hearing   Vision? 0 1  Difficulty concentrating or making decisions? 0 0  Walking or climbing stairs? 1 1  Comment goes slow and uses a cane   Dressing or bathing? 0 0  Doing errands, shopping? 1 1  Comment son Advice worker and eating ? N N  Using the Toilet? Y N  Comment constipation if she does not eat veggies   In the past six months, have you accidently leaked urine? N N  Do you have problems with loss of bowel control? N N  Managing your Medications? N N  Managing your Finances? N Y  Housekeeping or managing your Housekeeping? Malvin Johns  Comment son assist     Patient Care Team: Mcgregor Tinnon, Donalee Citrin, NP as PCP - General (Family Medicine) Lanier Prude, MD as PCP - Electrophysiology (Cardiology)  Indicate any recent Medical Services you may have received from other than [facility] providers in the past year (date may be approximate).     Assessment:   This is a routine wellness examination for Nocole.  Hearing/Vision screen No results found.  Dietary  issues and exercise activities discussed:     Goals Addressed   None    Depression Screen    07/22/2022    3:15 PM 05/08/2022    3:16 PM 03/07/2022   11:14 AM  PHQ 2/9 Scores  PHQ - 2 Score 0 0 0  Exception Documentation Other- indicate reason in comment box    Not completed AWV      Fall Risk    07/22/2022    3:13 PM 07/20/2022   10:12 PM 06/30/2022    2:19 PM 05/08/2022    3:16 PM 03/07/2022   11:14 AM  Fall Risk   Falls in the past year? 0 0 0 0 0  Number falls in past yr: 0 0 0 0 0  Injury with Fall? 0 0 0 0 0  Risk for fall due to : No Fall Risks  No Fall Risks No Fall Risks Impaired balance/gait;No Fall Risks  Follow up Falls evaluation completed  Falls evaluation completed Falls evaluation completed     MEDICARE RISK AT HOME:   TIMED UP AND GO:  Was the test performed?  No    Cognitive Function:        07/22/2022    3:15 PM  6CIT Screen  What Year? 0 points  What month? 0 points  What time? 0 points  Count back from 20 0 points  Months in reverse 0 points  Repeat phrase 0 points  Total Score 0 points    Immunizations Immunization History  Administered Date(s) Administered   COVID-19, mRNA, vaccine(Comirnaty)12 years and older 11/18/2021   Fluad Quad(high Dose 65+) 10/18/2021   Influenza Split 10/19/2016, 10/27/2016, 10/22/2017, 09/29/2018   Influenza, High Dose Seasonal PF 09/11/2020   Influenza-Unspecified 10/27/2016   PFIZER(Purple Top)SARS-COV-2 Vaccination 01/19/2019, 02/07/2019, 10/11/2019   Pneumococcal Conjugate-13 10/22/2016   Pneumococcal Polysaccharide-23 02/01/2018   Tdap 07/29/2017   Zoster Recombinant(Shingrix) 01/20/2017    TDAP status: Up to date  Flu Vaccine status: Up to date  Pneumococcal vaccine status: Up to date  Covid-19 vaccine status: Completed vaccines  Qualifies for Shingles Vaccine? Yes   Zostavax completed Yes   Shingrix Completed?: Yes  Screening Tests Health Maintenance  Topic Date Due   Zoster Vaccines-  Shingrix (2 of 2) 03/17/2017   INFLUENZA VACCINE  08/14/2022   Medicare Annual Wellness (AWV)  07/22/2023   DTaP/Tdap/Td (2 - Td or Tdap) 07/30/2027   Pneumonia Vaccine 33+ Years old  Completed   COVID-19 Vaccine  Completed   HPV VACCINES  Aged Out   DEXA SCAN  Discontinued    Health Maintenance  Health Maintenance Due  Topic Date Due   Zoster Vaccines- Shingrix (2 of 2) 03/17/2017  [facility]ingles [facility] in [facility] [place]   Colorectal cancer screening: No longer required.   Mammogram status: No longer required due to age .  Bone Density status: Ordered N/A. Pt provided with contact info and advised to call to schedule appt.  Lung Cancer Screening: (Low Dose CT Chest recommended if Age 7-80 years, 20 pack-year currently smoking OR have quit w/in 15years.) does not qualify.   Lung Cancer Screening Referral: No   Additional Screening:  Hepatitis C Screening: does not qualify; Completed No   Vision Screening: Recommended annual ophthalmology exams for early detection of glaucoma and other disorders of the eye. Is the patient up to date with their annual eye exam?  {YES/NO:21197} Who is the provider or what is the name of the office in which the patient attends annual eye exams? *** If pt is not established with a provider, would they like to be referred to a provider to establish care? {YES/NO:21197}.   Dental Screening: Recommended annual dental exams for proper oral hygiene  Diabetic Foot Exam: {Diabetic Foot Exam:[id number]}  Community Resource Referral / Chronic Care Management: CRR required this visit?  {YES/NO:21197}  CCM required this visit?  {CCM Required [phone]}     Plan:     I have personally reviewed and noted the following in the patient's chart:   Medical and social history Use of alcohol, tobacco or illicit drugs  Current medications and supplements including opioid prescriptions. {Opioid Prescriptions:[phone]} Functional ability and  status Nutritional status Physical activity Advanced directives List of other physicians Hospitalizations, surgeries, and ER visits in previous 12 months Vitals Screenings to include cognitive, depression, and falls Referrals and appointments  In addition, I have reviewed and discussed with patient certain preventive protocols, quality metrics, and best practice recommendations. A written personalized care plan for preventive services as well as general preventive health recommendations were provided to patient.     Caesar Bookman, NP   07/22/2022   After Visit Summary: {CHL AMB AWV After Visit Summary:[phone]}  Nurse Notes: ***

## 2022-07-23 NOTE — Patient Instructions (Signed)
Kristine Sherman , Thank you for taking time to come for your Medicare Wellness Visit. I appreciate your ongoing commitment to your health goals. Please review the following plan we discussed and let me know if I can assist you in the future.   Screening recommendations/referrals: Colonoscopy : N/A Mammogram : Up-to-date Bone Density up-to-date Recommended yearly ophthalmology/optometry visit for glaucoma screening and checkup Recommended yearly dental visit for hygiene and checkup  Vaccinations: Influenza vaccine- due annually in September/October Pneumococcal vaccine up-to-date Tdap vaccine up-to-date Shingles vaccine : obtain date for second dose from pharmacy  Advanced directives: No  Conditions/risks identified: Cardiac Risk Factors include: advanced age (>31men, >56 women)  Next appointment: 1 year    Preventive Care 87 Years and Older, Female Preventive care refers to lifestyle choices and visits with your health care provider that can promote health and wellness. What does preventive care include? A yearly physical exam. This is also called an annual well check. Dental exams once or twice a year. Routine eye exams. Ask your health care provider how often you should have your eyes checked. Personal lifestyle choices, including: Daily care of your teeth and gums. Regular physical activity. Eating a healthy diet. Avoiding tobacco and drug use. Limiting alcohol use. Practicing safe sex. Taking low-dose aspirin every day. Taking vitamin and mineral supplements as recommended by your health care provider. What happens during an annual well check? The services and screenings done by your health care provider during your annual well check will depend on your age, overall health, lifestyle risk factors, and family history of disease. Counseling  Your health care provider may ask you questions about your: Alcohol use. Tobacco use. Drug use. Emotional well-being. Home and  relationship well-being. Sexual activity. Eating habits. History of falls. Memory and ability to understand (cognition). Work and work Astronomer. Reproductive health. Screening  You may have the following tests or measurements: Height, weight, and BMI. Blood pressure. Lipid and cholesterol levels. These may be checked every 5 years, or more frequently if you are over 41 years old. Skin check. Lung cancer screening. You may have this screening every year starting at age 53 if you have a 30-pack-year history of smoking and currently smoke or have quit within the past 15 years. Fecal occult blood test (FOBT) of the stool. You may have this test every year starting at age 65. Flexible sigmoidoscopy or colonoscopy. You may have a sigmoidoscopy every 5 years or a colonoscopy every 10 years starting at age 66. Hepatitis C blood test. Hepatitis B blood test. Sexually transmitted disease (STD) testing. Diabetes screening. This is done by checking your blood sugar (glucose) after you have not eaten for a while (fasting). You may have this done every 1-3 years. Bone density scan. This is done to screen for osteoporosis. You may have this done starting at age 30. Mammogram. This may be done every 1-2 years. Talk to your health care provider about how often you should have regular mammograms. Talk with your health care provider about your test results, treatment options, and if necessary, the need for more tests. Vaccines  Your health care provider may recommend certain vaccines, such as: Influenza vaccine. This is recommended every year. Tetanus, diphtheria, and acellular pertussis (Tdap, Td) vaccine. You may need a Td booster every 10 years. Zoster vaccine. You may need this after age 19. Pneumococcal 13-valent conjugate (PCV13) vaccine. One dose is recommended after age 32. Pneumococcal polysaccharide (PPSV23) vaccine. One dose is recommended after age 23. Talk to your  health care provider  about which screenings and vaccines you need and how often you need them. This information is not intended to replace advice given to you by your health care provider. Make sure you discuss any questions you have with your health care provider. Document Released: 01/26/2015 Document Revised: 09/19/2015 Document Reviewed: 10/31/2014 Elsevier Interactive Patient Education  2017 [employer].  Fall Prevention in the Home Falls can cause injuries. They can happen to people of all ages. There are many things you can do to make your home safe and to help prevent falls. What can I do on the outside of my home? Regularly fix the edges of walkways and driveways and fix any cracks. Remove anything that might make you trip as you walk through a door, such as a raised step or threshold. Trim any bushes or trees on the path to your home. Use bright outdoor lighting. Clear any walking paths of anything that might make someone trip, such as rocks or tools. Regularly check to see if handrails are loose or broken. Make sure that both sides of any steps have handrails. Any raised decks and porches should have guardrails on the edges. Have any leaves, snow, or ice cleared regularly. Use sand or salt on walking paths during winter. Clean up any spills in your garage right away. This includes oil or grease spills. What can I do in the bathroom? Use night lights. Install grab bars by the toilet and in the tub and shower. Do not use towel bars as grab bars. Use non-skid mats or decals in the tub or shower. If you need to sit down in the shower, use a plastic, non-slip stool. Keep the floor dry. Clean up any water that spills on the floor as soon as it happens. Remove soap buildup in the tub or shower regularly. Attach bath mats securely with double-sided non-slip rug tape. Do not have throw rugs and other things on the floor that can make you trip. What can I do in the bedroom? Use night lights. Make sure  that you have a light by your bed that is easy to reach. Do not use any sheets or blankets that are too big for your bed. They should not hang down onto the floor. Have a firm chair that has side arms. You can use this for support while you get dressed. Do not have throw rugs and other things on the floor that can make you trip. What can I do in the kitchen? Clean up any spills right away. Avoid walking on wet floors. Keep items that you use a lot in easy-to-reach places. If you need to reach something above you, use a strong step stool that has a grab bar. Keep electrical cords out of the way. Do not use floor polish or wax that makes floors slippery. If you must use wax, use non-skid floor wax. Do not have throw rugs and other things on the floor that can make you trip. What can I do with my stairs? Do not leave any items on the stairs. Make sure that there are handrails on both sides of the stairs and use them. Fix handrails that are broken or loose. Make sure that handrails are as long as the stairways. Check any carpeting to make sure that it is firmly attached to the stairs. Fix any carpet that is loose or worn. Avoid having throw rugs at the top or bottom of the stairs. If you do have throw rugs, attach them  to the floor with carpet tape. Make sure that you have a light switch at the top of the stairs and the bottom of the stairs. If you do not have them, ask someone to add them for you. What else can I do to help prevent falls? Wear shoes that: Do not have high heels. Have rubber bottoms. Are comfortable and fit you well. Are closed at the toe. Do not wear sandals. If you use a stepladder: Make sure that it is fully opened. Do not climb a closed stepladder. Make sure that both sides of the stepladder are locked into place. Ask someone to hold it for you, if possible. Clearly mark and make sure that you can see: Any grab bars or handrails. First and last steps. Where the edge of  each step is. Use tools that help you move around (mobility aids) if they are needed. These include: Canes. Walkers. Scooters. Crutches. Turn on the lights when you go into a dark area. Replace any light bulbs as soon as they burn out. Set up your furniture so you have a clear path. Avoid moving your furniture around. If any of your floors are uneven, fix them. If there are any pets around you, be aware of where they are. Review your medicines with your doctor. Some medicines can make you feel dizzy. This can increase your chance of falling. Ask your doctor what other things that you can do to help prevent falls. This information is not intended to replace advice given to you by your health care provider. Make sure you discuss any questions you have with your health care provider. Document Released: 10/26/2008 Document Revised: 06/07/2015 Document Reviewed: 02/03/2014 Elsevier Interactive Patient Education  2017 [employer].

## 2022-07-31 ENCOUNTER — Encounter: Payer: Self-pay | Admitting: Cardiology

## 2022-07-31 ENCOUNTER — Ambulatory Visit: Payer: Medicare PPO | Attending: Cardiology | Admitting: Cardiology

## 2022-07-31 VITALS — BP 122/78 | HR 66 | Ht 65.0 in | Wt 124.2 lb

## 2022-07-31 DIAGNOSIS — Z950 Presence of cardiac pacemaker: Secondary | ICD-10-CM

## 2022-07-31 DIAGNOSIS — I495 Sick sinus syndrome: Secondary | ICD-10-CM | POA: Diagnosis not present

## 2022-07-31 NOTE — Progress Notes (Signed)
  Electrophysiology Office Follow up Visit Note:    Date:  07/31/2022   ID:  Kristine Sherman, DOB [birth year], MRN [id number]  PCP:  Caesar Bookman, NP  [facility] Cardiologist:  None  [facility] Electrophysiologist:  Lanier Prude, MD    Interval History:    Kristine Sherman is a 89 y.o. female who presents for a follow up visit.   Last seen in April 2023.  Since that time she has been doing well.  She goes on walks at least 3-4 times per week.  She is with her son today in clinic.       Past medical, surgical, social and family history were reviewed.  ROS:   Please see the history of present illness.    All other systems reviewed and are negative.  EKGs/Labs/Other Studies Reviewed:    The following studies were reviewed today:  July 31, 2022 in clinic device interrogation personally reviewed Battery and lead parameter stable No programming changes made today  EKG Interpretation Date/Time:  Thursday July 31 2022 11:33:43 EDT Ventricular Rate:  66 PR Interval:  246 QRS Duration:  72 QT Interval:  384 QTC Calculation: 402 R Axis:   -62  Text Interpretation: Atrial-paced rhythm with prolonged AV conduction Left axis deviation Confirmed by Steffanie Dunn [phone]) on 07/31/2022 11:49:27 AM    Physical Exam:    VS:  BP 122/78   Pulse 66   Ht 5\' 5"  (1.651 m)   Wt 124 lb 3.2 oz (56.3 kg)   SpO2 97%   BMI 20.67 kg/m     Wt Readings from Last 3 Encounters:  07/31/22 124 lb 3.2 oz (56.3 kg)  06/30/22 125 lb 9.6 oz (57 kg)  03/07/22 126 lb (57.2 kg)     GEN:  Well nourished, well developed in no acute distress CARDIAC: RRR, no murmurs, rubs, gallops.  Pacemaker pocket well-healed RESPIRATORY:  Clear to auscultation without rales, wheezing or rhonchi       ASSESSMENT:    1. SSS (sick sinus syndrome) (HCC)   2. Pacemaker    PLAN:    In order of problems listed above:   #Symptomatic bradycardia #Permanent pacemaker in situ Device functioning  appropriately.  Continue remote monitoring.  Follow-up 1 year with APP.      Signed, Steffanie Dunn, MD, [facility], [facility] 07/31/2022 11:55 AM    Electrophysiology [facility]

## 2022-07-31 NOTE — Patient Instructions (Signed)
Medication Instructions:  Your physician recommends that you continue on your current medications as directed. Please refer to the Current Medication list given to you today.  *If you need a refill on your cardiac medications before your next appointment, please call your pharmacy*  Follow-Up: At [facility], you and your health needs are our priority.  As part of our continuing mission to provide you with exceptional heart care, we have created designated Provider Care Teams.  These Care Teams include your primary Cardiologist (physician) and Advanced Practice Providers (APPs -  Physician Assistants and Nurse Practitioners) who all work together to provide you with the care you need, when you need it.  Your next appointment:   1 year(s)  Provider:   You will see one of the following Advanced Practice Providers on your designated Care Team:   Francis Dowse, Charlott Holler [address]" [place], [place] Sherie Don, NP Canary Brim, NP

## 2022-08-07 ENCOUNTER — Other Ambulatory Visit: Payer: Self-pay | Admitting: Family

## 2022-08-07 DIAGNOSIS — E039 Hypothyroidism, unspecified: Secondary | ICD-10-CM

## 2022-08-15 ENCOUNTER — Other Ambulatory Visit: Payer: Self-pay | Admitting: Family

## 2022-08-15 DIAGNOSIS — G609 Hereditary and idiopathic neuropathy, unspecified: Secondary | ICD-10-CM

## 2022-08-19 ENCOUNTER — Ambulatory Visit (INDEPENDENT_AMBULATORY_CARE_PROVIDER_SITE_OTHER): Payer: Medicare PPO

## 2022-08-19 DIAGNOSIS — I495 Sick sinus syndrome: Secondary | ICD-10-CM

## 2022-08-19 LAB — CUP PACEART REMOTE DEVICE CHECK
Battery Remaining Percentage: 40 %
Brady Statistic RA Percent Paced: 98 %
Brady Statistic RV Percent Paced: 1 %
Date Time Interrogation Session: 20240806091941
Implantable Lead Connection Status: 753985
Implantable Lead Connection Status: 753985
Implantable Lead Implant Date: 20161003
Implantable Lead Implant Date: 20161003
Implantable Lead Location: 753859
Implantable Lead Location: 753860
Implantable Lead Model: 377
Implantable Lead Model: 377
Implantable Lead Serial Number: 49295355
Implantable Lead Serial Number: 49335002
Implantable Pulse Generator Implant Date: 20161003
Lead Channel Impedance Value: 468 Ohm
Lead Channel Impedance Value: 507 Ohm
Lead Channel Pacing Threshold Amplitude: 0.8 V
Lead Channel Pacing Threshold Amplitude: 0.8 V
Lead Channel Pacing Threshold Pulse Width: 0.4 ms
Lead Channel Pacing Threshold Pulse Width: 0.4 ms
Lead Channel Sensing Intrinsic Amplitude: 1.5 mV
Lead Channel Sensing Intrinsic Amplitude: 5 mV
Lead Channel Setting Pacing Amplitude: 2.4 V
Lead Channel Setting Pacing Amplitude: 2.4 V
Lead Channel Setting Pacing Pulse Width: 0.4 ms
Pulse Gen Model: 394929
Pulse Gen Serial Number: 68595075

## 2022-09-04 NOTE — Progress Notes (Signed)
Remote pacemaker transmission.   

## 2022-09-18 ENCOUNTER — Encounter: Payer: Self-pay | Admitting: Orthopedic Surgery

## 2022-09-18 ENCOUNTER — Ambulatory Visit: Payer: Medicare PPO | Admitting: Orthopedic Surgery

## 2022-09-18 VITALS — BP 146/96 | HR 71 | Temp 97.5°F | Resp 16 | Ht 65.0 in | Wt 126.6 lb

## 2022-09-18 DIAGNOSIS — Z23 Encounter for immunization: Secondary | ICD-10-CM

## 2022-09-18 DIAGNOSIS — K5901 Slow transit constipation: Secondary | ICD-10-CM | POA: Diagnosis not present

## 2022-09-18 DIAGNOSIS — R141 Gas pain: Secondary | ICD-10-CM | POA: Diagnosis not present

## 2022-09-18 NOTE — Patient Instructions (Addendum)
Recommend Miralax twice daily x 2 days- you may stop when you have had a good bowel movement  Recommend senna stool softener/laxative every other day to help with bowel movements- take at night   Recommend good hydration with water  May try prune juice or apple juice every morning for bowels  Simethicone tablets for gas pain- may purchase at local drug store  [facility]- [phone]

## 2022-09-18 NOTE — Progress Notes (Signed)
Careteam: Patient Care Team: Ngetich, Donalee Citrin, NP as PCP - General (Family Medicine) Lanier Prude, MD as PCP - Electrophysiology (Cardiology)  Seen by: Hazle Nordmann, AGNP-C  PLACE OF SERVICE:  [facility]  Advanced Directive information Does Patient Have a Medical Advance Directive?: Yes, Type of Advance Directive: Healthcare Power of [place];Living will;Out of facility DNR (pink MOST or yellow form), Does patient want to make changes to medical advance directive?: No - Patient declined  Allergies  Allergen Reactions   Oxycodone-Aspirin Other (See Comments)    Neurologic reaction - caused seizures, passed out (reaction to Percodan)     Sulfa Antibiotics Hives, Itching and Rash    Chief Complaint  Patient presents with   Acute Visit    Patient complains of pain on left side of abdomen. Patient reports having some gas in stomach.      HPI: Patient is a 89 y.o. female seen today for acute visit due to constipation.   She c/o left sided abdominal pain x 2 days. LBM 09/03, described as normal. She tried a suppository this morning without success. She is eating about 2 meals daily. Does not always drink water well. Denies N/V or abdominal pain. Denis taking stool softener or laxative on daily basis.   Also having intermittent gas pain. Pain occurs after eating or with flatulence. Does not drink carbonated drinks.   Requesting flu vaccine today.   Review of Systems:  Review of Systems  Constitutional:  Negative for fever.  Respiratory:  Negative for shortness of breath.   Cardiovascular:  Negative for chest pain.  Gastrointestinal:  Positive for abdominal pain and constipation. Negative for blood in stool, heartburn, nausea and vomiting.  Musculoskeletal:  Negative for back pain.  Psychiatric/Behavioral:  Negative for depression. The patient is not nervous/anxious.     Past Medical History:  Diagnosis Date   CKD (chronic kidney disease)    Glaucoma     Hyperlipidemia    Hypertension    Hypothyroidism    Neuropathy    Pacemaker    Seasonal allergies    Vertigo    Vitamin B12 deficiency    Past Surgical History:  Procedure Laterality Date   ABDOMINAL HYSTERECTOMY     DG GALL BLADDER     Social History:   reports that she has never smoked. She has never used smokeless tobacco. She reports current alcohol use of about 1.0 standard drink of alcohol per week. She reports that she does not use drugs.  Family History  Problem Relation Age of Onset   Rheum arthritis Mother    Breast cancer Sister     Medications: Patient's Medications  New Prescriptions   No medications on file  Previous Medications   ACETAMINOPHEN (TYLENOL) 325 MG TABLET    Take 650 mg by mouth every 6 (six) hours as needed for headache.   ASPIRIN EC 81 MG TABLET    Take 81 mg by mouth at bedtime. Swallow whole.   ATORVASTATIN (LIPITOR) 40 MG TABLET    Take 40 mg by mouth every morning.   BRIMONIDINE (ALPHAGAN) 0.2 % OPHTHALMIC SOLUTION    Place 1 drop into both eyes 3 (three) times daily.   CYANOCOBALAMIN (VITAMIN B-12 SL)    Place 500 mcg under the tongue every morning. 1 spray - 500 mcg   DORZOLAMIDE-TIMOLOL (COSOPT) 22.3-6.8 MG/ML OPHTHALMIC SOLUTION    Place 1 drop into both eyes 2 (two) times daily.   GABAPENTIN (NEURONTIN) 100 MG CAPSULE  TAKE 2 CAPSULES BY MOUTH AT BEDTIME   LEVOTHYROXINE (SYNTHROID) 50 MCG TABLET    TAKE 1 TABLET BY MOUTH EVERY DAY   MECLIZINE (ANTIVERT) 12.5 MG TABLET    Take 12.5 mg by mouth 3 (three) times daily as needed for dizziness.   NETARSUDIL-LATANOPROST (ROCKLATAN) 0.02-0.005 % SOLN    Place 1 drop into both eyes at bedtime.   POLYETHYL GLYCOL-PROPYL GLYCOL (SYSTANE PRESERVATIVE FREE OP)    Place 1 drop into both eyes 4 (four) times daily as needed (dry eyes). Systane Complete Preservative Free  Modified Medications   No medications on file  Discontinued Medications   No medications on file    Physical Exam:  Vitals:    09/18/22 1431 09/18/22 1433  BP: (!) 140/100 (!) 146/96  Pulse: 71   Resp: 16   Temp: (!) 97.5 F (36.4 C)   SpO2: 97%   Weight: 126 lb 9.6 oz (57.4 kg)   Height: 5\' 5"  (1.651 m)    Body mass index is 21.07 kg/m. Wt Readings from Last 3 Encounters:  09/18/22 126 lb 9.6 oz (57.4 kg)  07/31/22 124 lb 3.2 oz (56.3 kg)  06/30/22 125 lb 9.6 oz (57 kg)    Physical Exam Vitals reviewed.  Constitutional:      General: She is not in acute distress.    Appearance: She is not ill-appearing.  Eyes:     General:        Right eye: No discharge.        Left eye: No discharge.  Cardiovascular:     Rate and Rhythm: Normal rate and regular rhythm.     Pulses: Normal pulses.     Heart sounds: Normal heart sounds.  Pulmonary:     Effort: Pulmonary effort is normal.     Breath sounds: Normal breath sounds.  Abdominal:     General: There is no distension.     Palpations: Abdomen is soft. There is no mass.     Tenderness: There is no abdominal tenderness. There is no guarding or rebound.     Hernia: No hernia is present.     Comments: Hypoactive bowel sounds LLQ  Musculoskeletal:     Cervical back: Neck supple.     Right lower leg: No edema.     Left lower leg: No edema.  Skin:    General: Skin is warm.     Capillary Refill: Capillary refill takes less than 2 seconds.  Neurological:     General: No focal deficit present.     Mental Status: She is alert and oriented to person, place, and time.  Psychiatric:        Mood and Affect: Mood normal.     Labs reviewed: Basic Metabolic Panel: Recent Labs    01/09/22 0944 03/07/22 1207 05/09/22 0907 06/30/22 1532  NA 136 140 136 138  K 4.9 3.9 4.6 4.1  CL 102 105 102 104  CO2 26 27 27 30   GLUCOSE 82 81 89 78  BUN 12 18 17 22   CREATININE 1.21* 1.15* 1.25* 1.15*  CALCIUM 9.3 8.8 8.9 9.2  TSH 2.62  --   --  0.89   Liver Function Tests: Recent Labs    01/09/22 0944 03/07/22 1207 06/30/22 1532  AST 18 17 19   ALT 12 14 19    BILITOT 0.7 0.7 0.6  PROT 7.0 6.3 6.6   No results for input(s): "LIPASE", "AMYLASE" in the last 8760 hours. No results for input(s): "AMMONIA" in the  last 8760 hours. CBC: Recent Labs    03/07/22 1207 05/09/22 0907 06/30/22 1532  WBC 4.1 5.3 4.5  NEUTROABS 2,071 3,196 2,367  HGB 10.4* 10.8* 11.2*  HCT 35.0 35.9 37.2  MCV 72.6* 72.1* 72.9*  PLT 178 168 192   Lipid Panel: Recent Labs    01/09/22 0944 06/30/22 1532  CHOL 152 161  HDL 50 55  LDLCALC 78 83  TRIG 143 130  CHOLHDL 3.0 2.9   TSH: Recent Labs    01/09/22 0944 06/30/22 1532  TSH 2.62 0.89   A1C: No results found for: "HGBA1C"   Assessment/Plan 1. Slow transit constipation - LBM 09/03> normal - no abdominal distension, LLQ hypoactive bowel sounds - recommend miralax BID x 2 days> may stop if bowel movements return to normal or diarrhea - recommend senna- 2 tablets every night every other day - encourage hydration with water - recommend glass of prune juice or apple juice in AM  2. Gas pain - see above - intermittent left sided abdominal pain or with flatulence - recommend simethicone chewable tablets prn - avoid carbonated drinks  3. Need for influenza vaccination - Flu Vaccine Trivalent High Dose (Fluad)  Total time: 26 minutes. Greater than 50% of total time spent doing patient education regarding health maintenance, constipation and gas pain including symptom/medication management.    Next appt: Visit date not found  Tanayah Squitieri Scherry Ran  [facility] [phone]

## 2022-10-08 DIAGNOSIS — Z961 Presence of intraocular lens: Secondary | ICD-10-CM | POA: Diagnosis not present

## 2022-10-08 DIAGNOSIS — H401133 Primary open-angle glaucoma, bilateral, severe stage: Secondary | ICD-10-CM | POA: Diagnosis not present

## 2022-10-08 DIAGNOSIS — H04123 Dry eye syndrome of bilateral lacrimal glands: Secondary | ICD-10-CM | POA: Diagnosis not present

## 2022-11-12 ENCOUNTER — Telehealth: Payer: Self-pay | Admitting: Cardiology

## 2022-11-12 ENCOUNTER — Ambulatory Visit: Payer: Medicare PPO | Admitting: Cardiology

## 2022-11-12 NOTE — Progress Notes (Deleted)
Electrophysiology Office Follow up Visit Note:    Date:  11/12/2022   ID:  Kristine Sherman, DOB 1929/01/07, MRN [id number]  PCP:  Caesar Bookman, NP  [facility] Cardiologist:  None  [facility] Electrophysiologist:  Lanier Prude, MD    Interval History:     Kristine Sherman is a 89 y.o. female who presents for a follow up visit.   Discussed the use of AI scribe software for clinical note transcription with the patient, who gave verbal consent to proceed.  History of Present Illness                Past medical, surgical, social and family history were reviewed.  ROS:   Please see the history of present illness.    All other systems reviewed and are negative.  EKGs/Labs/Other Studies Reviewed:    The following studies were reviewed today:       Physical Exam:    VS:  There were no vitals taken for this visit.    Wt Readings from Last 3 Encounters:  09/18/22 126 lb 9.6 oz (57.4 kg)  07/31/22 124 lb 3.2 oz (56.3 kg)  06/30/22 125 lb 9.6 oz (57 kg)     GEN: *** Well nourished, well developed in no acute distress CARDIAC: ***RRR, no murmurs, rubs, gallops RESPIRATORY:  Clear to auscultation without rales, wheezing or rhonchi       ASSESSMENT:    No diagnosis found. PLAN:    In order of problems listed above:  Assessment and Plan                    Signed, Steffanie Dunn, MD, [facility], [facility] 11/12/2022 5:58 AM    Electrophysiology  [facility]

## 2022-11-12 NOTE — Telephone Encounter (Signed)
Pt calling stating she received a call this morning. She has an appt this afternoon for pacer check. Requesting call

## 2022-11-12 NOTE — Telephone Encounter (Signed)
Left message for patient to call back.  She saw Dr. Lalla Brothers in July 2024 with plans to come back in one year. She does not need appointment today unless there are any symptoms or concerns.

## 2022-11-18 ENCOUNTER — Ambulatory Visit (INDEPENDENT_AMBULATORY_CARE_PROVIDER_SITE_OTHER): Payer: Medicare PPO

## 2022-11-18 DIAGNOSIS — I495 Sick sinus syndrome: Secondary | ICD-10-CM | POA: Diagnosis not present

## 2022-11-19 LAB — CUP PACEART REMOTE DEVICE CHECK
Battery Voltage: 40
Date Time Interrogation Session: 20241105112951
Implantable Lead Connection Status: 753985
Implantable Lead Connection Status: 753985
Implantable Lead Implant Date: 20161003
Implantable Lead Implant Date: 20161003
Implantable Lead Location: 753859
Implantable Lead Location: 753860
Implantable Lead Model: 377
Implantable Lead Model: 377
Implantable Lead Serial Number: 49295355
Implantable Lead Serial Number: 49335002
Implantable Pulse Generator Implant Date: 20161003
Pulse Gen Model: 394929
Pulse Gen Serial Number: 68595075

## 2022-12-16 NOTE — Progress Notes (Signed)
Remote pacemaker transmission.   

## 2022-12-30 ENCOUNTER — Ambulatory Visit: Payer: Medicare PPO | Admitting: Family

## 2023-01-01 DIAGNOSIS — H04123 Dry eye syndrome of bilateral lacrimal glands: Secondary | ICD-10-CM | POA: Diagnosis not present

## 2023-01-01 DIAGNOSIS — H35363 Drusen (degenerative) of macula, bilateral: Secondary | ICD-10-CM | POA: Diagnosis not present

## 2023-01-01 DIAGNOSIS — H35373 Puckering of macula, bilateral: Secondary | ICD-10-CM | POA: Diagnosis not present

## 2023-01-01 DIAGNOSIS — H401133 Primary open-angle glaucoma, bilateral, severe stage: Secondary | ICD-10-CM | POA: Diagnosis not present

## 2023-01-02 ENCOUNTER — Encounter: Payer: Self-pay | Admitting: Nurse Practitioner

## 2023-01-02 ENCOUNTER — Ambulatory Visit: Payer: Medicare PPO | Admitting: Nurse Practitioner

## 2023-01-02 VITALS — BP 138/88 | HR 79 | Temp 97.5°F | Resp 17 | Ht 65.0 in | Wt 125.2 lb

## 2023-01-02 DIAGNOSIS — E7800 Pure hypercholesterolemia, unspecified: Secondary | ICD-10-CM

## 2023-01-02 DIAGNOSIS — Z950 Presence of cardiac pacemaker: Secondary | ICD-10-CM

## 2023-01-02 DIAGNOSIS — E039 Hypothyroidism, unspecified: Secondary | ICD-10-CM | POA: Diagnosis not present

## 2023-01-02 DIAGNOSIS — N1832 Chronic kidney disease, stage 3b: Secondary | ICD-10-CM

## 2023-01-02 DIAGNOSIS — G629 Polyneuropathy, unspecified: Secondary | ICD-10-CM | POA: Diagnosis not present

## 2023-01-02 NOTE — Assessment & Plan Note (Signed)
Bun/creat 22/1.15 06/30/22

## 2023-01-02 NOTE — Assessment & Plan Note (Signed)
Followed by cardiology 

## 2023-01-02 NOTE — Patient Instructions (Signed)
Labs prior to the next 6 mos f/u

## 2023-01-02 NOTE — Assessment & Plan Note (Signed)
on Gabapentin hs, Vit B12 400 03/07/22

## 2023-01-02 NOTE — Assessment & Plan Note (Addendum)
on Levothyroxine, TSH 0.89 06/30/22 CBC/diff, CMP/eGFR, TSH, lipids, Vit D, Vit B12

## 2023-01-02 NOTE — Assessment & Plan Note (Signed)
on Atorvastatin, LDL 83 06/30/22

## 2023-01-02 NOTE — Progress Notes (Signed)
Location:   Clinic [facility]   Place of Service:    Provider: Chipper Oman NP  Code Status: DNR Goals of Care:     01/02/2023    1:22 PM  Advanced Directives  Does Patient Have a Medical Advance Directive? No  Would patient like information on creating a medical advance directive? No - Patient declined     Chief Complaint  Patient presents with   Medical Management of Chronic Issues    6 month follow up and discuss shingles and covid vaccines.    HPI: Patient is a 89 y.o. female seen today for medical management of chronic diseases.    HLD, on Atorvastatin, LDL 83 06/30/22  Hypothyroidism, on Levothyroxine, TSH 0.89 06/30/22  Peripheral neuropathy, on Gabapentin hs, Vit B12 400 03/07/22  CKD Bun/creat 22/1.15 06/30/22  Pace maker followed by cardiology.    Past Medical History:  Diagnosis Date   CKD (chronic kidney disease)    Glaucoma    Hyperlipidemia    Hypertension    Hypothyroidism    Neuropathy    Pacemaker    Seasonal allergies    Vertigo    Vitamin B12 deficiency     Past Surgical History:  Procedure Laterality Date   ABDOMINAL HYSTERECTOMY     DG GALL BLADDER      Allergies  Allergen Reactions   Oxycodone-Aspirin Other (See Comments)    Neurologic reaction - caused seizures, passed out (reaction to Percodan)     Sulfa Antibiotics Hives, Itching and Rash    Allergies as of 01/02/2023       Reactions   Oxycodone-aspirin Other (See Comments)   Neurologic reaction - caused seizures, passed out (reaction to Percodan)   Sulfa Antibiotics Hives, Itching, Rash        Medication List        Accurate as of January 02, 2023  4:18 PM. If you have any questions, ask your nurse or doctor.          acetaminophen 325 MG tablet Commonly known as: TYLENOL Take 650 mg by mouth every 6 (six) hours as needed for headache.   aspirin EC 81 MG tablet Take 81 mg by mouth at bedtime. Swallow whole.   atorvastatin 40 MG tablet Commonly known as:  LIPITOR Take 40 mg by mouth every morning.   brimonidine 0.2 % ophthalmic solution Commonly known as: ALPHAGAN Place 1 drop into both eyes 3 (three) times daily.   dorzolamide-timolol 2-0.5 % ophthalmic solution Commonly known as: COSOPT Place 1 drop into both eyes 2 (two) times daily.   gabapentin 100 MG capsule Commonly known as: NEURONTIN TAKE 2 CAPSULES BY MOUTH AT BEDTIME   levothyroxine 50 MCG tablet Commonly known as: SYNTHROID TAKE 1 TABLET BY MOUTH EVERY DAY   meclizine 12.5 MG tablet Commonly known as: ANTIVERT Take 12.5 mg by mouth 3 (three) times daily as needed for dizziness.   Rocklatan 0.02-0.005 % Soln Generic drug: Netarsudil-Latanoprost Place 1 drop into both eyes at bedtime.   SYSTANE PRESERVATIVE FREE OP Place 1 drop into both eyes 4 (four) times daily as needed (dry eyes). Systane Complete Preservative Free   VITAMIN B-12 SL Place 500 mcg under the tongue every morning. 1 spray - 500 mcg        Review of Systems:  Review of Systems  Constitutional:  Negative for appetite change, fatigue and fever.  HENT:  Negative for congestion and trouble swallowing.   Eyes:  Negative for visual disturbance.  Respiratory:  Negative for cough and shortness of breath.   Cardiovascular:  Negative for leg swelling.  Gastrointestinal:  Negative for abdominal pain, constipation, nausea and vomiting.  Genitourinary:  Negative for dysuria, frequency and urgency.       1x/night  Musculoskeletal:  Positive for arthralgias. Negative for gait problem.       Cane  Lower back aches.  Skin:  Negative for color change.  Neurological:  Negative for weakness and headaches.  Psychiatric/Behavioral:  Negative for confusion and sleep disturbance. The patient is not nervous/anxious.     Health Maintenance  Topic Date Due   Zoster Vaccines- Shingrix (2 of 2) 03/17/2017   COVID-19 Vaccine (6 - 2024-25 season) 12/11/2022   Medicare Annual Wellness (AWV)  07/22/2023    DTaP/Tdap/Td (2 - Td or Tdap) 07/30/2027   Pneumonia Vaccine 74+ Years old  Completed   INFLUENZA VACCINE  Completed   HPV VACCINES  Aged Out   DEXA SCAN  Discontinued    Physical Exam: Vitals:   01/02/23 1324  BP: 138/88  Pulse: 79  Resp: 17  Temp: (!) 97.5 F (36.4 C)  SpO2: 99%  Weight: 125 lb 3.2 oz (56.8 kg)  Height: 5\' 5"  (1.651 m)   Body mass index is 20.83 kg/m. Physical Exam Vitals and nursing note reviewed.  Constitutional:      Appearance: Normal appearance.  HENT:     Head: Normocephalic and atraumatic.     Nose: Nose normal.     Mouth/Throat:     Mouth: Mucous membranes are moist.  Eyes:     Extraocular Movements: Extraocular movements intact.     Conjunctiva/sclera: Conjunctivae normal.     Pupils: Pupils are equal, round, and reactive to light.  Cardiovascular:     Rate and Rhythm: Normal rate and regular rhythm.     Heart sounds: No murmur heard. Pulmonary:     Breath sounds: No rales.  Abdominal:     General: Bowel sounds are normal.     Palpations: Abdomen is soft.     Tenderness: There is no abdominal tenderness.  Musculoskeletal:     Cervical back: Normal range of motion and neck supple.     Right lower leg: No edema.     Left lower leg: No edema.  Skin:    General: Skin is warm and dry.  Neurological:     General: No focal deficit present.     Mental Status: She is alert and oriented to person, place, and time. Mental status is at baseline.     Gait: Gait normal.  Psychiatric:        Mood and Affect: Mood normal.        Behavior: Behavior normal.        Thought Content: Thought content normal.        Judgment: Judgment normal.     Labs reviewed: Basic Metabolic Panel: Recent Labs    01/09/22 0944 03/07/22 1207 05/09/22 0907 06/30/22 1532  NA 136 140 136 138  K 4.9 3.9 4.6 4.1  CL 102 105 102 104  CO2 26 27 27 30   GLUCOSE 82 81 89 78  BUN 12 18 17 22   CREATININE 1.21* 1.15* 1.25* 1.15*  CALCIUM 9.3 8.8 8.9 9.2  TSH 2.62   --   --  0.89   Liver Function Tests: Recent Labs    01/09/22 0944 03/07/22 1207 06/30/22 1532  AST 18 17 19   ALT 12 14 19   BILITOT 0.7 0.7 0.6  PROT  7.0 6.3 6.6   No results for input(s): "LIPASE", "AMYLASE" in the last 8760 hours. No results for input(s): "AMMONIA" in the last 8760 hours. CBC: Recent Labs    03/07/22 1207 05/09/22 0907 06/30/22 1532  WBC 4.1 5.3 4.5  NEUTROABS 2,071 3,196 2,367  HGB 10.4* 10.8* 11.2*  HCT 35.0 35.9 37.2  MCV 72.6* 72.1* 72.9*  PLT 178 168 192   Lipid Panel: Recent Labs    01/09/22 0944 06/30/22 1532  CHOL 152 161  HDL 50 55  LDLCALC 78 83  TRIG 143 130  CHOLHDL 3.0 2.9   No results found for: "HGBA1C"  Procedures since last visit: No results found.  Assessment/Plan  Acquired hypothyroidism on Levothyroxine, TSH 0.89 06/30/22 CBC/diff, CMP/eGFR, TSH, lipids, Vit D, Vit B12  Neuropathy on Gabapentin hs, Vit B12 400 03/07/22  Stage 3b chronic kidney disease (HCC) Bun/creat 22/1.15 06/30/22   Hypercholesteremia on Atorvastatin, LDL 83 06/30/22  Pacemaker Followed by cardiology.    Labs/tests ordered:  CBC/diff, CMP/eGFR, TSH, lipids, Vit D, Vit B12 Next appt:  6 months

## 2023-02-17 ENCOUNTER — Ambulatory Visit (INDEPENDENT_AMBULATORY_CARE_PROVIDER_SITE_OTHER): Payer: Medicare PPO

## 2023-02-17 DIAGNOSIS — I495 Sick sinus syndrome: Secondary | ICD-10-CM | POA: Diagnosis not present

## 2023-02-18 LAB — CUP PACEART REMOTE DEVICE CHECK
Battery Voltage: 40
Date Time Interrogation Session: 20250204094130
Implantable Lead Connection Status: 753985
Implantable Lead Connection Status: 753985
Implantable Lead Implant Date: 20161003
Implantable Lead Implant Date: 20161003
Implantable Lead Location: 753859
Implantable Lead Location: 753860
Implantable Lead Model: 377
Implantable Lead Model: 377
Implantable Lead Serial Number: 49295355
Implantable Lead Serial Number: 49335002
Implantable Pulse Generator Implant Date: 20161003
Pulse Gen Model: 394929
Pulse Gen Serial Number: 68595075

## 2023-02-21 ENCOUNTER — Encounter: Payer: Self-pay | Admitting: Cardiology

## 2023-03-26 NOTE — Progress Notes (Signed)
 Remote pacemaker transmission.

## 2023-03-26 NOTE — Addendum Note (Signed)
 Addended by: Geralyn Flash D on: 03/26/2023 03:23 PM   Modules accepted: Orders

## 2023-04-24 DIAGNOSIS — H401133 Primary open-angle glaucoma, bilateral, severe stage: Secondary | ICD-10-CM | POA: Diagnosis not present

## 2023-04-24 DIAGNOSIS — H04123 Dry eye syndrome of bilateral lacrimal glands: Secondary | ICD-10-CM | POA: Diagnosis not present

## 2023-04-24 DIAGNOSIS — Z961 Presence of intraocular lens: Secondary | ICD-10-CM | POA: Diagnosis not present

## 2023-05-13 ENCOUNTER — Other Ambulatory Visit: Payer: Self-pay | Admitting: Family

## 2023-05-13 DIAGNOSIS — E039 Hypothyroidism, unspecified: Secondary | ICD-10-CM

## 2023-05-19 ENCOUNTER — Ambulatory Visit (INDEPENDENT_AMBULATORY_CARE_PROVIDER_SITE_OTHER): Payer: Medicare PPO

## 2023-05-19 DIAGNOSIS — I495 Sick sinus syndrome: Secondary | ICD-10-CM | POA: Diagnosis not present

## 2023-05-19 LAB — CUP PACEART REMOTE DEVICE CHECK
Date Time Interrogation Session: 20250506080250
Implantable Lead Connection Status: 753985
Implantable Lead Connection Status: 753985
Implantable Lead Implant Date: 20161003
Implantable Lead Implant Date: 20161003
Implantable Lead Location: 753859
Implantable Lead Location: 753860
Implantable Lead Model: 377
Implantable Lead Model: 377
Implantable Lead Serial Number: 49295355
Implantable Lead Serial Number: 49335002
Implantable Pulse Generator Implant Date: 20161003
Pulse Gen Model: 394929
Pulse Gen Serial Number: 68595075

## 2023-05-20 ENCOUNTER — Encounter: Payer: Self-pay | Admitting: Cardiology

## 2023-05-22 ENCOUNTER — Other Ambulatory Visit: Payer: Self-pay | Admitting: Family

## 2023-05-22 DIAGNOSIS — E039 Hypothyroidism, unspecified: Secondary | ICD-10-CM

## 2023-05-22 NOTE — Telephone Encounter (Unsigned)
 Copied from CRM [id number]. Topic: Clinical - Medication Refill >> May 22, 2023  3:46 PM Brynn Caras wrote: Medication: levothyroxine  (SYNTHROID ) 50 MCG tablet   Has the patient contacted their pharmacy? No (Agent: If no, request that the patient contact the pharmacy for the refill. If patient does not wish to contact the pharmacy document the reason why and proceed with request.) (Agent: If yes, when and what did the pharmacy advise?)  This is the patient's preferred pharmacy:  [id number] - [place], [place] - [address] AT [address]NER OF [facility] [address] [place] [address] Phone: [phone] Fax: [phone]  Is this the correct pharmacy for this prescription? Yes If no, delete pharmacy and type the correct one.   Has the prescription been filled recently? No  Is the patient out of the medication? Yes  Has the patient been seen for an appointment in the last year OR does the patient have an upcoming appointment? Yes  Can we respond through MyChart? Yes  Agent: Please be advised that Rx refills may take up to 3 business days. We ask that you follow-up with your pharmacy.

## 2023-05-25 MED ORDER — LEVOTHYROXINE SODIUM 50 MCG PO TABS: 50.0000 ug | ORAL_TABLET | Freq: Every day | ORAL | 1 refills | Status: DC

## 2023-06-08 IMAGING — CT CT HEAD W/O CM
3 series · 16 of 47 positions shown, 19 images · non-contrast
Comparison: None.

CLINICAL DATA: Dizziness, persistent/recurrent, cardiac or vascular
cause suspected



[Series 3: head wo · axial · 0.42mm/px · z∈[-177,-42]mm · 10 of 33 slices shown, 13 images]
[im 3/33  brain]
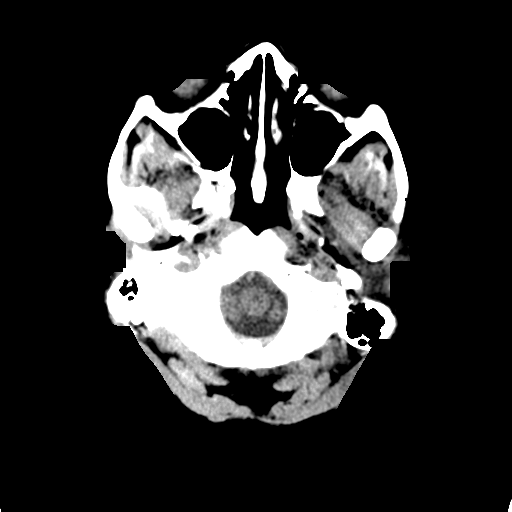
[im 3/33  bone]
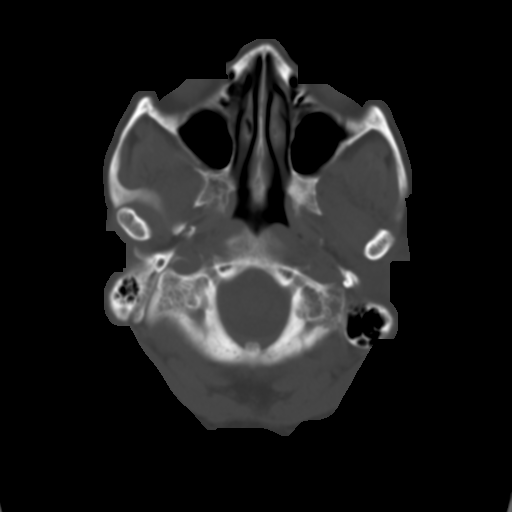
[im 6/33  brain]
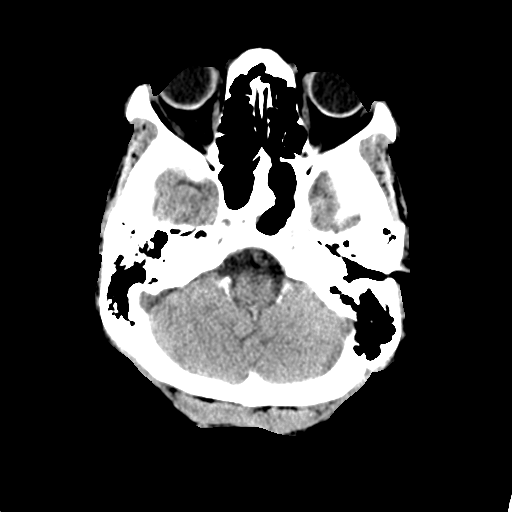
[im 9/33  brain]
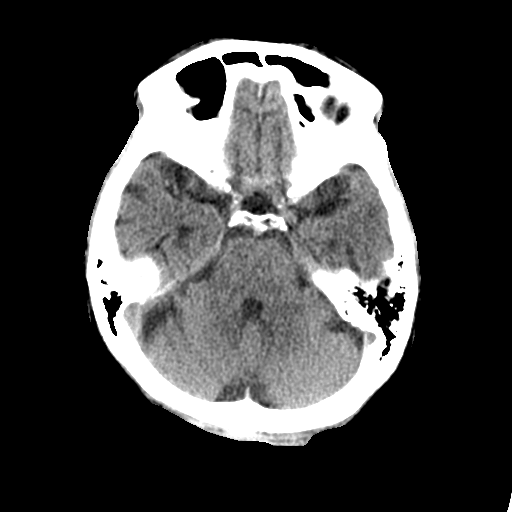
[im 12/33  brain]
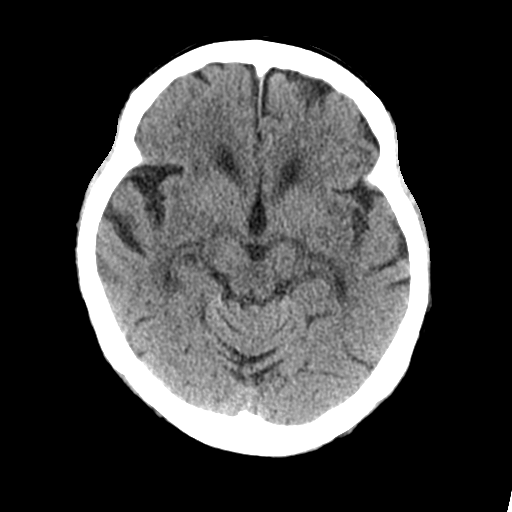
[im 15/33  brain]
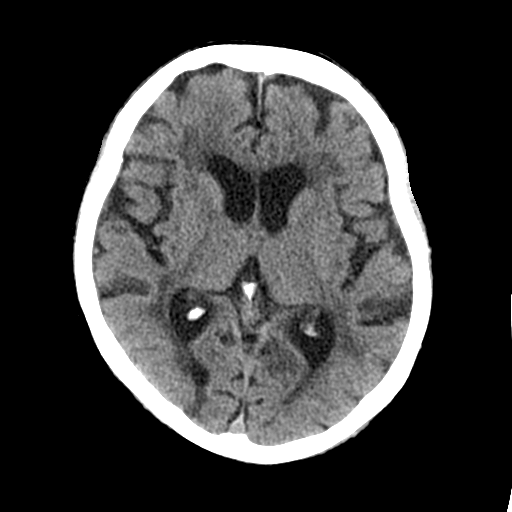
[im 15/33  bone]
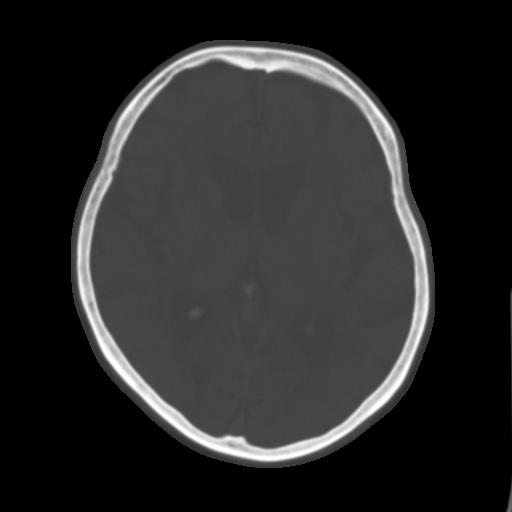
[im 18/33  brain]
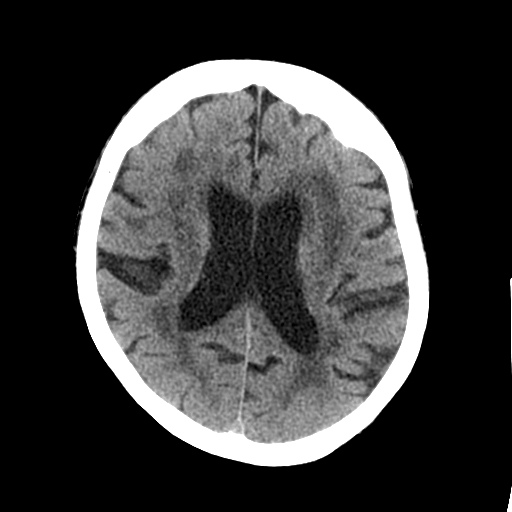
[im 21/33  brain]
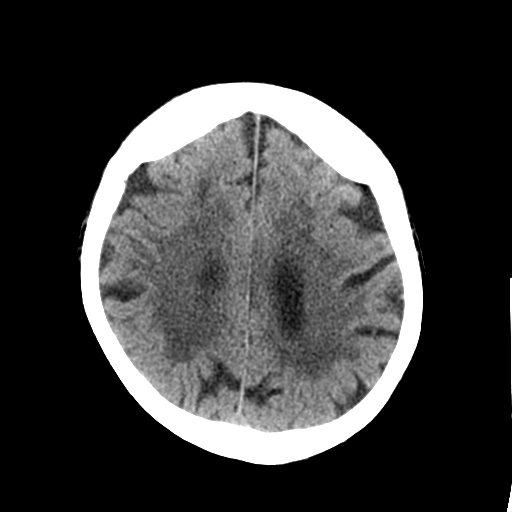
[im 25/33  brain]
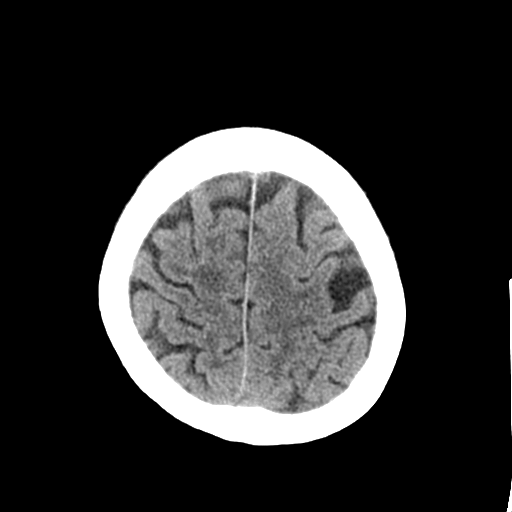
[im 27/33  brain]
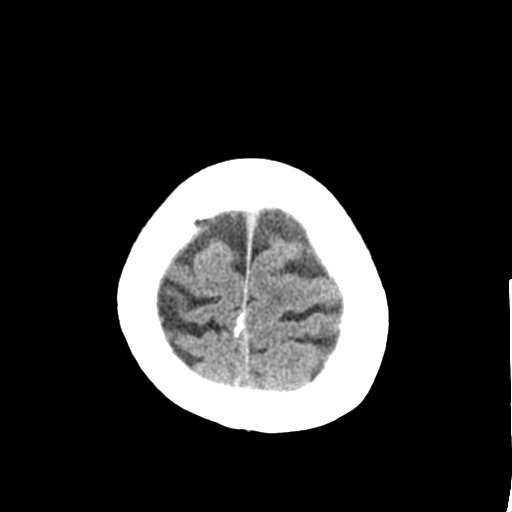
[im 27/33  bone]
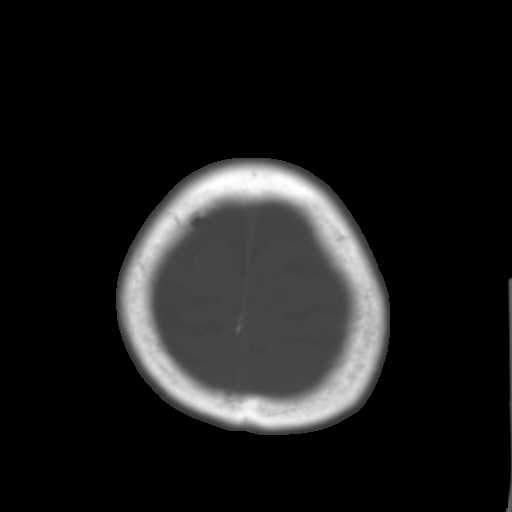
[im 30/33  brain]
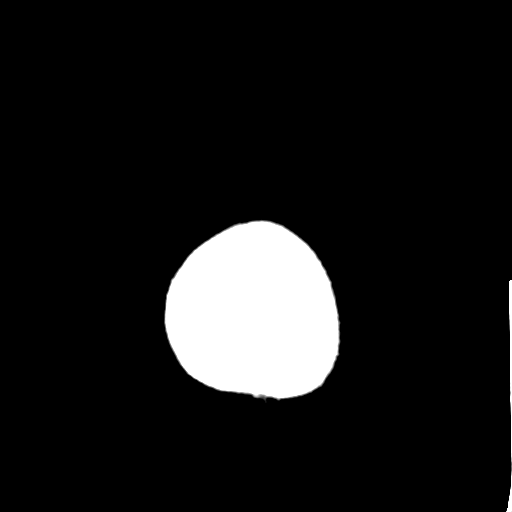

[Series 5: coronal soft tissue · coronal · 0.34mm/px · 3 of 66 slices shown]
[im 22/66  brain]
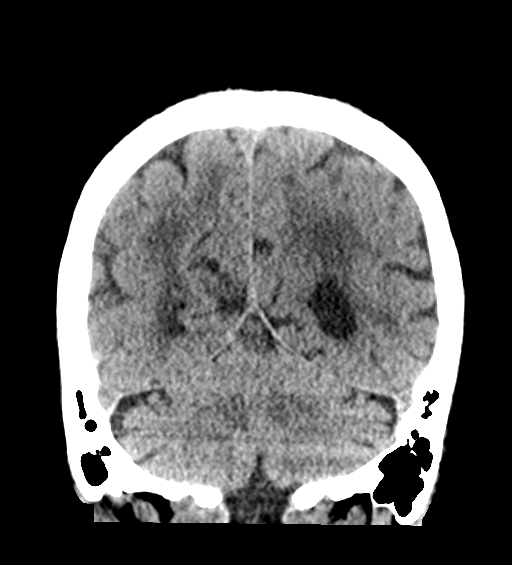
[im 29/66  brain]
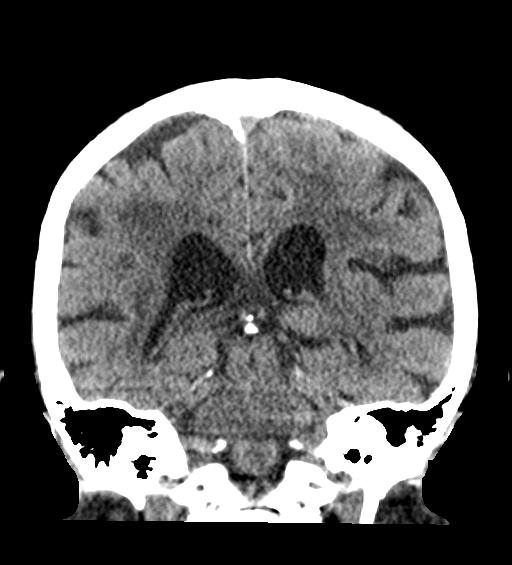
[im 37/66  brain]
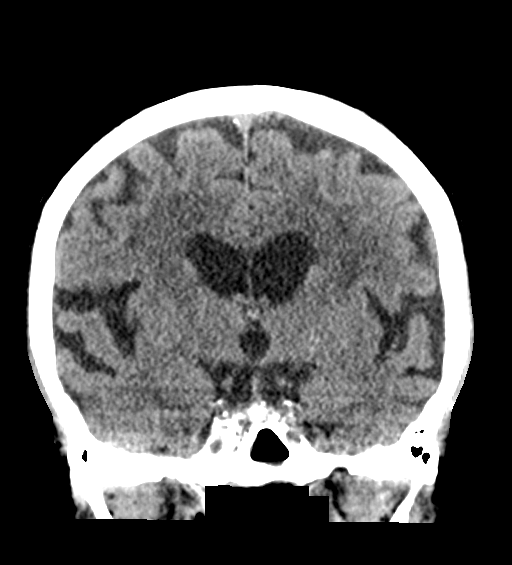

[Series 6: sagittal soft tissue · sagittal · 0.37mm/px · 3 of 58 slices shown]
[im 20/58  brain]
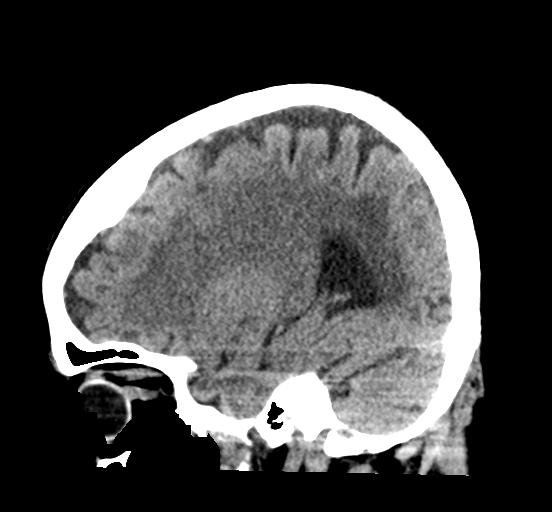
[im 29/58  brain]
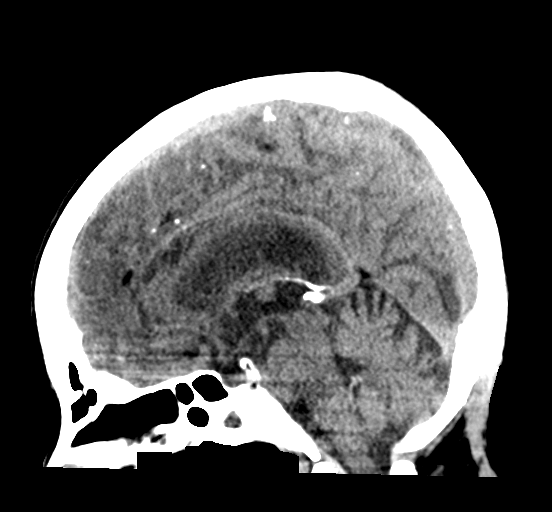
[im 39/58  brain]
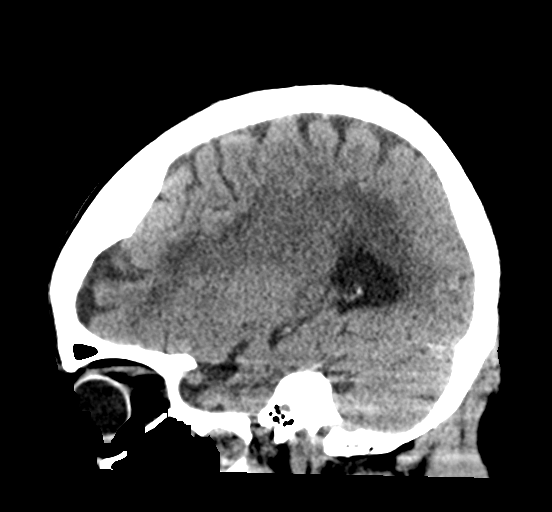

[16 of 47 positions shown; findings below may reference images not displayed]

BRAIN:
BRAIN
Patchy and confluent areas of decreased attenuation are noted
throughout the deep and periventricular white matter of the cerebral
hemispheres bilaterally, compatible with chronic microvascular
ischemic disease.

No evidence of large-territorial acute infarction. No parenchymal
hemorrhage. No mass lesion. No extra-axial collection.

No mass effect or midline shift. No hydrocephalus. Basilar cisterns
are patent.

Vascular: No hyperdense vessel. Atherosclerotic calcifications are
present within the cavernous internal carotid arteries.

Skull: No acute fracture or focal lesion.

Sinuses/Orbits: Paranasal sinuses and mastoid air cells are clear.
Query bilateral lens replacement. Otherwise the orbits are
unremarkable.

Other: None.
IMPRESSION: No acute intracranial abnormality.

## 2023-06-30 DIAGNOSIS — H1045 Other chronic allergic conjunctivitis: Secondary | ICD-10-CM | POA: Diagnosis not present

## 2023-06-30 DIAGNOSIS — H04123 Dry eye syndrome of bilateral lacrimal glands: Secondary | ICD-10-CM | POA: Diagnosis not present

## 2023-07-02 NOTE — Progress Notes (Signed)
 Remote pacemaker transmission.

## 2023-07-12 ENCOUNTER — Emergency Department (HOSPITAL_COMMUNITY)
Admission: EM | Admit: 2023-07-12 | Discharge: 2023-07-13 | Disposition: A | Discharge status: Home / Self Care | Attending: Student | Admitting: Student

## 2023-07-12 ENCOUNTER — Encounter (HOSPITAL_COMMUNITY): Payer: Self-pay | Admitting: Emergency Medicine

## 2023-07-12 ENCOUNTER — Emergency Department (HOSPITAL_COMMUNITY)

## 2023-07-12 ENCOUNTER — Other Ambulatory Visit: Payer: Self-pay

## 2023-07-12 DIAGNOSIS — R5383 Other fatigue: Secondary | ICD-10-CM | POA: Diagnosis not present

## 2023-07-12 DIAGNOSIS — Z950 Presence of cardiac pacemaker: Secondary | ICD-10-CM | POA: Insufficient documentation

## 2023-07-12 DIAGNOSIS — I159 Secondary hypertension, unspecified: Secondary | ICD-10-CM

## 2023-07-12 DIAGNOSIS — R600 Localized edema: Secondary | ICD-10-CM | POA: Insufficient documentation

## 2023-07-12 DIAGNOSIS — Z7982 Long term (current) use of aspirin: Secondary | ICD-10-CM | POA: Diagnosis not present

## 2023-07-12 DIAGNOSIS — I10 Essential (primary) hypertension: Secondary | ICD-10-CM | POA: Diagnosis not present

## 2023-07-12 DIAGNOSIS — E039 Hypothyroidism, unspecified: Secondary | ICD-10-CM | POA: Diagnosis not present

## 2023-07-12 DIAGNOSIS — R0600 Dyspnea, unspecified: Secondary | ICD-10-CM

## 2023-07-12 DIAGNOSIS — R609 Edema, unspecified: Secondary | ICD-10-CM | POA: Diagnosis not present

## 2023-07-12 DIAGNOSIS — I129 Hypertensive chronic kidney disease with stage 1 through stage 4 chronic kidney disease, or unspecified chronic kidney disease: Secondary | ICD-10-CM | POA: Diagnosis not present

## 2023-07-12 DIAGNOSIS — N183 Chronic kidney disease, stage 3 unspecified: Secondary | ICD-10-CM | POA: Diagnosis not present

## 2023-07-12 DIAGNOSIS — R0602 Shortness of breath: Secondary | ICD-10-CM | POA: Diagnosis not present

## 2023-07-12 NOTE — ED Triage Notes (Signed)
 Pt presents reporting she had swelling she noted in her legs today that prompted her to check her BP which was 180/103.  Pt reports she does not have a hx of hypertension and does not take medications.

## 2023-07-12 NOTE — ED Provider Notes (Signed)
 [place] EMERGENCY DEPARTMENT AT [facility] Provider Note   CSN: [id number] Arrival date & time: 07/12/23  2019     Patient presents with: Hypertension   Kristine Sherman is a 89 y.o. female with past medical history of acquired hypothyroidism, chronic anemia, HTN, GERD, SSS, CKD stage III, pacemaker presents to emergency department for evaluation of shortness of breath, pedal edema, nausea, hypertension.  Reports that she has been having shortness of breath over the past 2 days.  Normally walks around the block and has been walking her normal distance with no breaks or difficulty.  She denies cough, congestion, fevers, CP  She also complains of feeling fatigued and bilateral pedal edema over the past 2 weeks.  Reports that when she noticed pedal edema today, she took her blood pressure and noted it to be 180 SBP.  No visual disturbances nor headache.  She denies recent surgery, recent travel, history of blood clots, HA, visual disturbances.  Of note, was on blood pressure medication amlodipine and losartan for 20 years but recently discontinued it by PCP and has not been on them for the past 2-3 years per pt and family    Hypertension Associated symptoms include shortness of breath.       Prior to Admission medications   Medication Sig Start Date End Date Taking? Authorizing Provider  acetaminophen  (TYLENOL ) 325 MG tablet Take 650 mg by mouth every 6 (six) hours as needed for headache.    [provider]  aspirin  EC 81 MG tablet Take 81 mg by mouth at bedtime. Swallow whole.    [provider]  atorvastatin (LIPITOR) 40 MG tablet Take 40 mg by mouth every morning. 01/10/21   [provider]  brimonidine  (ALPHAGAN ) 0.2 % ophthalmic solution Place 1 drop into both eyes 3 (three) times daily. 03/11/21   [provider]  Cyanocobalamin (VITAMIN B-12 SL) Place 500 mcg under the tongue every morning. 1 spray - 500 mcg    [provider]  dorzolamide -timolol  (COSOPT ) 22.3-6.8 MG/ML ophthalmic solution Place 1 drop into both eyes 2 (two) times daily. 03/11/21   [provider]  gabapentin  (NEURONTIN ) 100 MG capsule TAKE 2 CAPSULES BY MOUTH AT BEDTIME 08/15/22   Ngetich, Dinah C, NP  levothyroxine  (SYNTHROID ) 50 MCG tablet Take 1 tablet (50 mcg total) by mouth daily. 05/25/23   Ngetich, Dinah C, NP  meclizine  (ANTIVERT ) 12.5 MG tablet Take 12.5 mg by mouth 3 (three) times daily as needed for dizziness.    [provider]  Netarsudil -Latanoprost  (ROCKLATAN ) 0.02-0.005 % SOLN Place 1 drop into both eyes at bedtime.    [provider]  Polyethyl Glycol-Propyl Glycol (SYSTANE PRESERVATIVE FREE OP) Place 1 drop into both eyes 4 (four) times daily as needed (dry eyes). Systane Complete Preservative Free    [provider]    Allergies: Oxycodone-aspirin  and Sulfa antibiotics    Review of Systems  Respiratory:  Positive for shortness of breath.   Cardiovascular:  Positive for leg swelling.    Updated Vital Signs BP (!) 165/92   Pulse 64   Temp 98.3 F (36.8 C) (Oral)   Resp 19   SpO2 100%   Physical Exam Vitals and nursing note reviewed.  Constitutional:      General: She is not in acute distress.    Appearance: Normal appearance.  HENT:     Head: Normocephalic and atraumatic.   Eyes:     General: No visual field deficit.    Conjunctiva/sclera:  Conjunctivae normal.    Cardiovascular:     Rate and Rhythm: Normal rate.  Pulmonary:     Effort: Pulmonary effort is normal. No respiratory distress.     Breath sounds: Normal breath sounds.     Comments: Speaking full complete sentences without difficulty.  Maintaining oxygen saturation without supplementation.  Lungs are CTAB Abdominal:     General: There is no distension.     Palpations: Abdomen is soft.     Tenderness: There is no abdominal tenderness. There is no guarding or rebound.   Musculoskeletal:     Right lower leg: 1+  Edema present.     Left lower leg: 1+ Edema present.   Skin:    Coloration: Skin is not jaundiced or pale.   Neurological:     Mental Status: She is alert and oriented to person, place, and time. Mental status is at baseline.     GCS: GCS eye subscore is 4. GCS verbal subscore is 5. GCS motor subscore is 6.     Cranial Nerves: No dysarthria.     Sensory: Sensation is intact. No sensory deficit.     Coordination: Coordination normal.     Gait: Gait normal.     (all labs ordered are listed, but only abnormal results are displayed) Labs Reviewed  BASIC METABOLIC PANEL WITH GFR  CBC  BRAIN NATRIURETIC PEPTIDE  TSH  TROPONIN I (HIGH SENSITIVITY)  TROPONIN I (HIGH SENSITIVITY)    EKG: None  Radiology: DG Chest 2 View Result Date: 07/12/2023 CLINICAL DATA:  Shortness of breath. EXAM: CHEST - 2 VIEW COMPARISON:  03/24/2018. FINDINGS: Dual lead left-sided pacemaker in place.The cardiomediastinal contours are normal. Minimal scarring at the left lung base. Pulmonary vasculature is normal. No consolidation, pleural effusion, or pneumothorax. No acute osseous abnormalities are seen. IMPRESSION: No acute chest findings. Electronically Signed   By: Andrea Gasman M.D.   On: 07/12/2023 22:18      Medications Ordered in the ED - No data to display                                  Medical Decision Making Amount and/or Complexity of Data Reviewed Labs: ordered. Radiology: ordered.   Patient presents to the ED for concern of HTN, pedal edema, this involves an extensive number of treatment options, and is a complaint that carries with it a high risk of complications and morbidity.  The differential diagnosis includes hypertensive crisis, fluid overload, HF exacerbation   Co morbidities that complicate the patient evaluation  HPI History of hypertension   Additional history obtained:  Additional history obtained from Family, Nursing, and Outside Medical Records   External  records from outside source obtained and reviewed including triage RN note, family at bedside, most recent PCP note, cardiology note   Lab Tests:  I Ordered, and personally interpreted labs.   Pending    Imaging Studies ordered:  I ordered imaging studies including CXR  I independently visualized and interpreted imaging which showed no acute chest findings I agree with the radiologist interpretation   Cardiac Monitoring:  The patient was maintained on a cardiac monitor.  I personally viewed and interpreted the cardiac monitored which showed an underlying rhythm of: NSR at 66 bpm with no T wave nor ST abnormalities    Problem List / ED Course:  SHOB No SHOB at rest CXR negative for effusion, PNA  Fatigue Will check for  anemia, infection as etiology  Bilateral pedal edema Very mild. 1+ bilaterally CXR negative for effusion, PNA  HTN No complaints of HA, visual disturbances Hx of HTN. Previously was on amlodipine and losartan for multiple yrs but has been off them for past 2-64yrs per PCP Has not been checking at home but checked today d/t pedal edema so may not be acute Today 165/92. Will check for AKI but do not think that IV BP control in ED is necessary   Reevaluation:  After the interventions noted above, I reevaluated the patient and found that they have :stayed the same   Social Determinants of Health:  Has PCP   Dispostion:  Dispo pending labwork. Ultimately, if no significiant lab abnormality and if patient id not requiring admission, I suspect patient with be discharged with a bp medication to control bp. She can follow up with PCP, cardiology outpatient  Sign out to [facility] pending labs, dispo   Final diagnoses:  Hypertension, unspecified type  Pedal edema  Other fatigue  Dyspnea, unspecified type    ED Discharge Orders     None          Minnie Tinnie BRAVO, PA 07/12/23 2345    Albertina Dixon, MD 07/13/23 [phone]

## 2023-07-13 LAB — BASIC METABOLIC PANEL WITH GFR
Anion gap: 8 (ref 5–15)
BUN: 20 mg/dL (ref 8–23)
CO2: 23 mmol/L (ref 22–32)
Calcium: 8.3 mg/dL — ABNORMAL LOW (ref 8.9–10.3)
Chloride: 97 mmol/L — ABNORMAL LOW (ref 98–111)
Creatinine, Ser: 0.94 mg/dL (ref 0.44–1.00)
GFR, Estimated: 57 mL/min — ABNORMAL LOW (ref >60)
Glucose, Bld: 81 mg/dL (ref 70–99)
Potassium: 4.4 mmol/L (ref 3.5–5.1)
Sodium: 128 mmol/L — ABNORMAL LOW (ref 135–145)

## 2023-07-13 LAB — CBC
HCT: 35.5 % — ABNORMAL LOW (ref 36.0–46.0)
Hemoglobin: 10.6 g/dL — ABNORMAL LOW (ref 12.0–15.0)
MCH: 22.1 pg — ABNORMAL LOW (ref 26.0–34.0)
MCHC: 29.9 g/dL — ABNORMAL LOW (ref 30.0–36.0)
MCV: 74 fL — ABNORMAL LOW (ref 80.0–100.0)
Platelets: 118 10*3/uL — ABNORMAL LOW (ref 150–400)
RBC: 4.8 MIL/uL (ref 3.87–5.11)
RDW: 16.1 % — ABNORMAL HIGH (ref 11.5–15.5)
WBC: 5.6 10*3/uL (ref 4.0–10.5)
nRBC: 0 % (ref 0.0–0.2)

## 2023-07-13 LAB — BRAIN NATRIURETIC PEPTIDE: B Natriuretic Peptide: 28 pg/mL (ref 0.0–100.0)

## 2023-07-13 LAB — TROPONIN I (HIGH SENSITIVITY): Troponin I (High Sensitivity): 5 ng/L (ref <18)

## 2023-07-13 MED ORDER — AMLODIPINE BESYLATE 5 MG PO TABS: 5.0000 mg | ORAL_TABLET | Freq: Every day | ORAL | 0 refills | Status: DC

## 2023-07-13 NOTE — Discharge Instructions (Addendum)
 Your workup tonight was reassuring.  As discussed I have sent a prescription for amlodipine to your pharmacy.  Please follow up with your primary care provider for further evaluation of your blood pressure and recommendations for medications as needed. If you develop any life threatening symptoms return to the emergency department.

## 2023-07-13 NOTE — ED Provider Notes (Signed)
  Physical Exam  BP (!) 173/89   Pulse 63   Temp (!) 97.5 F (36.4 C)   Resp 17   SpO2 97%   Physical Exam  Procedures  Procedures  ED Course / MDM    Medical Decision Making Amount and/or Complexity of Data Reviewed Labs: ordered. Radiology: ordered.   Patient care assumed at shift handoff from Tinnie Matter, PA-C.  See her note for full details.  Patient presented to the emergency department complaining of hypertension with shortness of breath when ambulating long distances and increased pedal edema.  Plan to follow-up on labs to evaluate for any signs of organ damage due to hypertension.  Plan to discuss blood pressure medication with patient if labs are normal, recommend follow-up with primary care for further blood pressure management  Creatinine 0.94, troponin 5, BNP 28.  Chest x-ray unremarkable showing no vascular congestion.  Lungs clear to auscultation.  Patient denies headache.  At this time no sign of endorgan damage/hypertensive urgency.  I discussed the findings with the patient and she would like to proceed with restarting her amlodipine.  I will send a prescription to her pharmacy and have her follow-up with her primary care provider.  Discharge home at this time with return precautions.   Logan Ubaldo NOVAK, PA-C 07/13/23 0124    Jerral Meth, MD 07/13/23 [phone]

## 2023-07-22 ENCOUNTER — Ambulatory Visit: Admitting: Adult Health

## 2023-07-22 ENCOUNTER — Encounter: Payer: Self-pay | Admitting: Adult Health

## 2023-07-22 ENCOUNTER — Other Ambulatory Visit: Payer: Self-pay | Admitting: Adult Health

## 2023-07-22 VITALS — BP 126/78 | HR 75 | Temp 97.6°F | Resp 18 | Ht 65.0 in | Wt 125.6 lb

## 2023-07-22 DIAGNOSIS — G609 Hereditary and idiopathic neuropathy, unspecified: Secondary | ICD-10-CM

## 2023-07-22 DIAGNOSIS — E559 Vitamin D deficiency, unspecified: Secondary | ICD-10-CM | POA: Diagnosis not present

## 2023-07-22 DIAGNOSIS — N1832 Chronic kidney disease, stage 3b: Secondary | ICD-10-CM | POA: Diagnosis not present

## 2023-07-22 DIAGNOSIS — E039 Hypothyroidism, unspecified: Secondary | ICD-10-CM

## 2023-07-22 DIAGNOSIS — I10 Essential (primary) hypertension: Secondary | ICD-10-CM | POA: Diagnosis not present

## 2023-07-22 DIAGNOSIS — E7800 Pure hypercholesterolemia, unspecified: Secondary | ICD-10-CM | POA: Diagnosis not present

## 2023-07-22 MED ORDER — DORZOLAMIDE HCL-TIMOLOL MAL 2-0.5 % OP SOLN: 1.0000 [drp] | Freq: Two times a day (BID) | OPHTHALMIC | 6 refills | Status: AC

## 2023-07-22 MED ORDER — BRIMONIDINE TARTRATE 0.2 % OP SOLN: 1.0000 [drp] | Freq: Three times a day (TID) | OPHTHALMIC | 6 refills | Status: AC

## 2023-07-22 MED ORDER — LEVOTHYROXINE SODIUM 50 MCG PO TABS: 50.0000 ug | ORAL_TABLET | Freq: Every day | ORAL | 1 refills | Status: AC

## 2023-07-22 MED ORDER — AMLODIPINE BESYLATE 5 MG PO TABS: 5.0000 mg | ORAL_TABLET | Freq: Every day | ORAL | 3 refills | Status: DC

## 2023-07-22 MED ORDER — ATORVASTATIN CALCIUM 40 MG PO TABS: 40.0000 mg | ORAL_TABLET | Freq: Every morning | ORAL | 3 refills | Status: AC

## 2023-07-22 MED ORDER — GABAPENTIN 100 MG PO CAPS: 200.0000 mg | ORAL_CAPSULE | Freq: Every day | ORAL | 3 refills | Status: DC

## 2023-07-22 MED ORDER — MECLIZINE HCL 12.5 MG PO TABS
12.5000 mg | ORAL_TABLET | Freq: Three times a day (TID) | ORAL | 0 refills | Status: AC | PRN
Start: 2023-07-22 — End: ?

## 2023-07-22 MED ORDER — ROCKLATAN 0.02-0.005 % OP SOLN: 1.0000 [drp] | Freq: Every day | OPHTHALMIC | 6 refills | Status: AC

## 2023-07-22 NOTE — Progress Notes (Signed)
 Location:  [facility]   Place of Service:   [facility] clinic    CODE STATUS:   Allergies  Allergen Reactions   Oxycodone-Aspirin  Other (See Comments)    Neurologic reaction - caused seizures, passed out (reaction to Percodan)     Sulfa Antibiotics Hives, Itching and Rash    Chief Complaint  Patient presents with   Medical Management of Chronic Issues    6 month follow up & issues with blood pressure    HPI:  About 2 weeks ago went to the ED for elevated blood pressure. The highest 160/103. The lowest reading 80's/50's in the AM. Does get a slight headache at times; does resolved on own. She did have some lower extremity in the past. Today there is no edema present. She is taking medications as prescribed. She takes norvasc  5 mg daily for her hypertension.   Past Medical History:  Diagnosis Date   CKD (chronic kidney disease)    Glaucoma    Hyperlipidemia    Hypertension    Hypothyroidism    Neuropathy    Pacemaker    Seasonal allergies    Vertigo    Vitamin B12 deficiency     Past Surgical History:  Procedure Laterality Date   ABDOMINAL HYSTERECTOMY     DG GALL BLADDER      Social History   Socioeconomic History   Marital status: Divorced    Spouse name: Not on file   Number of children: Not on file   Years of education: Not on file   Highest education level: Master's degree (e.g., MA, MS, MEng, MEd, MSW, MBA)  Occupational History   Not on file  Tobacco Use   Smoking status: Never   Smokeless tobacco: Never  Substance and Sexual Activity   Alcohol use: Yes    Alcohol/week: 1.0 standard drink of alcohol    Types: 1 Glasses of wine per week    Comment: monthly   Drug use: Never   Sexual activity: Not on file  Other Topics Concern   Not on file  Social History Narrative   Not on file   Social Drivers of Health   Financial Resource Strain: Low Risk  (12/29/2022)   Overall Financial Resource Strain (CARDIA)    Difficulty of Paying Living  Expenses: Not hard at all  Food Insecurity: Unknown (06/26/2022)   Hunger Vital Sign    Worried About Running Out of Food in the Last Year: Never true    Ran Out of Food in the Last Year: Not on file  Transportation Needs: Unknown (06/26/2022)   PRAPARE - Administrator, Civil Service (Medical): No    Lack of Transportation (Non-Medical): Not on file  Physical Activity: Insufficiently Active (06/26/2022)   Exercise Vital Sign    Days of Exercise per Week: 3 days    Minutes of Exercise per Session: 30 min  Stress: No Stress Concern Present (06/26/2022)   [employer] of Occupational Health - Occupational Stress Questionnaire    Feeling of Stress : Not at all  Social Connections: Unknown (12/29/2022)   Social Connection and Isolation Panel    Frequency of Communication with Friends and Family: More than three times a week    Frequency of Social Gatherings with Friends and Family: Patient declined    Attends Religious Services: Patient declined    Database administrator or Organizations: Yes    Attends Banker Meetings: Not on file    Marital Status:  Divorced  Intimate Partner Violence: Unknown (04/19/2021)   Received from [facility]   HITS    Physically Hurt: Not on file    Insult or Talk Down To: Not on file    Threaten Physical Harm: Not on file    Scream or Curse: Not on file   Family History  Problem Relation Age of Onset   Rheum arthritis Mother    Breast cancer Sister       VITAL SIGNS BP (!) 152/90   Pulse 75   Temp 97.6 F (36.4 C)   Resp 18   Ht 5' 5 (1.651 m)   Wt 125 lb 9.6 oz (57 kg)   SpO2 97%   BMI 20.90 kg/m   Outpatient Encounter Medications as of 07/22/2023  Medication Sig   acetaminophen  (TYLENOL ) 325 MG tablet Take 650 mg by mouth every 6 (six) hours as needed for headache.   amLODipine  (NORVASC ) 5 MG tablet Take 1 tablet (5 mg total) by mouth daily.   aspirin  EC 81 MG tablet Take 81 mg by mouth at bedtime. Swallow  whole.   atorvastatin  (LIPITOR) 40 MG tablet Take 40 mg by mouth every morning.   brimonidine  (ALPHAGAN ) 0.2 % ophthalmic solution Place 1 drop into both eyes 3 (three) times daily.   Cyanocobalamin (VITAMIN B-12 SL) Place 500 mcg under the tongue every morning. 1 spray - 500 mcg   dorzolamide -timolol  (COSOPT ) 22.3-6.8 MG/ML ophthalmic solution Place 1 drop into both eyes 2 (two) times daily.   gabapentin  (NEURONTIN ) 100 MG capsule TAKE 2 CAPSULES BY MOUTH AT BEDTIME   levothyroxine  (SYNTHROID ) 50 MCG tablet Take 1 tablet (50 mcg total) by mouth daily.   meclizine  (ANTIVERT ) 12.5 MG tablet Take 12.5 mg by mouth 3 (three) times daily as needed for dizziness.   Netarsudil -Latanoprost  (ROCKLATAN ) 0.02-0.005 % SOLN Place 1 drop into both eyes at bedtime.   Polyethyl Glycol-Propyl Glycol (SYSTANE PRESERVATIVE FREE OP) Place 1 drop into both eyes 4 (four) times daily as needed (dry eyes). Systane Complete Preservative Free   No facility-administered encounter medications on file as of 07/22/2023.     SIGNIFICANT DIAGNOSTIC EXAMS   Review of Systems  Constitutional:  Negative for malaise/fatigue.  Respiratory:  Negative for cough and shortness of breath.   Cardiovascular:  Negative for chest pain, palpitations and leg swelling.  Gastrointestinal:  Negative for abdominal pain, constipation and heartburn.  Musculoskeletal:  Negative for back pain, joint pain and myalgias.  Skin: Negative.   Neurological:  Negative for dizziness.  Psychiatric/Behavioral:  The patient is not nervous/anxious.    Physical Exam Constitutional:      General: She is not in acute distress.    Appearance: She is well-developed. She is not diaphoretic.  Neck:     Thyroid: No thyromegaly.  Cardiovascular:     Rate and Rhythm: Normal rate and regular rhythm.     Heart sounds: Normal heart sounds.  Pulmonary:     Effort: Pulmonary effort is normal. No respiratory distress.     Breath sounds: Normal breath sounds.   Abdominal:     General: Bowel sounds are normal. There is no distension.     Palpations: Abdomen is soft.     Tenderness: There is no abdominal tenderness.  Musculoskeletal:        General: Normal range of motion.     Cervical back: Neck supple.     Right lower leg: No edema.     Left lower leg: No edema.  Lymphadenopathy:  Cervical: No cervical adenopathy.  Skin:    General: Skin is warm and dry.  Neurological:     Mental Status: She is alert and oriented to person, place, and time.  Psychiatric:        Mood and Affect: Mood normal.      ASSESSMENT/ PLAN:  TODAY  Essential hypertension: at this time will continue amlodipine .  Hypothyroidism: will recheck labs Stage 3b chronic kidney disease: will follow up with lab work  Vitamin D  deficiency Hypercholesterolemia: will follow up with lab work    Barnie Seip NP [facility]   call [phone]

## 2023-07-23 ENCOUNTER — Ambulatory Visit: Payer: Medicare PPO | Admitting: Adult Health

## 2023-07-23 ENCOUNTER — Ambulatory Visit: Payer: Self-pay

## 2023-07-23 LAB — COMPREHENSIVE METABOLIC PANEL WITH GFR
AG Ratio: 1.8 (calc) (ref 1.0–2.5)
ALT: 12 U/L (ref 6–29)
AST: 19 U/L (ref 10–35)
Albumin: 4.1 g/dL (ref 3.6–5.1)
Alkaline phosphatase (APISO): 111 U/L (ref 37–153)
BUN/Creatinine Ratio: 13 (calc) (ref 6–22)
BUN: 15 mg/dL (ref 7–25)
CO2: 27 mmol/L (ref 20–32)
Calcium: 8.8 mg/dL (ref 8.6–10.4)
Chloride: 101 mmol/L (ref 98–110)
Creat: 1.15 mg/dL — ABNORMAL HIGH (ref 0.60–0.95)
Globulin: 2.3 g/dL (ref 1.9–3.7)
Glucose, Bld: 88 mg/dL (ref 65–99)
Potassium: 4.1 mmol/L (ref 3.5–5.3)
Sodium: 134 mmol/L — ABNORMAL LOW (ref 135–146)
Total Bilirubin: 0.6 mg/dL (ref 0.2–1.2)
Total Protein: 6.4 g/dL (ref 6.1–8.1)
eGFR: 45 mL/min/1.73m2 — ABNORMAL LOW (ref >=60)

## 2023-07-23 LAB — LIPID PANEL
Cholesterol: 125 mg/dL (ref <200)
HDL: 57 mg/dL (ref >=50)
LDL Cholesterol (Calc): 51 mg/dL
Non-HDL Cholesterol (Calc): 68 mg/dL (ref <130)
Total CHOL/HDL Ratio: 2.2 (calc) (ref <5.0)
Triglycerides: 89 mg/dL (ref <150)

## 2023-07-23 LAB — VITAMIN D 25 HYDROXY (VIT D DEFICIENCY, FRACTURES): Vit D, 25-Hydroxy: 42 ng/mL (ref 30–100)

## 2023-07-23 LAB — TSH: TSH: 4.43 m[IU]/L (ref 0.40–4.50)

## 2023-07-24 ENCOUNTER — Encounter: Payer: Medicare PPO | Admitting: Family

## 2023-08-13 ENCOUNTER — Other Ambulatory Visit: Payer: Self-pay

## 2023-08-13 ENCOUNTER — Encounter (HOSPITAL_COMMUNITY): Payer: Self-pay | Admitting: Emergency Medicine

## 2023-08-13 ENCOUNTER — Emergency Department (HOSPITAL_COMMUNITY)
Admission: EM | Admit: 2023-08-13 | Discharge: 2023-08-13 | Disposition: A | Discharge status: Home / Self Care | Attending: Emergency Medicine | Admitting: Emergency Medicine

## 2023-08-13 ENCOUNTER — Emergency Department (HOSPITAL_COMMUNITY)

## 2023-08-13 DIAGNOSIS — Z950 Presence of cardiac pacemaker: Secondary | ICD-10-CM | POA: Diagnosis not present

## 2023-08-13 DIAGNOSIS — Z7982 Long term (current) use of aspirin: Secondary | ICD-10-CM | POA: Diagnosis not present

## 2023-08-13 DIAGNOSIS — R55 Syncope and collapse: Secondary | ICD-10-CM | POA: Insufficient documentation

## 2023-08-13 DIAGNOSIS — R0602 Shortness of breath: Secondary | ICD-10-CM | POA: Diagnosis not present

## 2023-08-13 DIAGNOSIS — E860 Dehydration: Secondary | ICD-10-CM | POA: Insufficient documentation

## 2023-08-13 DIAGNOSIS — I959 Hypotension, unspecified: Secondary | ICD-10-CM | POA: Diagnosis not present

## 2023-08-13 DIAGNOSIS — Z79899 Other long term (current) drug therapy: Secondary | ICD-10-CM | POA: Diagnosis not present

## 2023-08-13 DIAGNOSIS — R42 Dizziness and giddiness: Secondary | ICD-10-CM | POA: Diagnosis not present

## 2023-08-13 LAB — COMPREHENSIVE METABOLIC PANEL WITH GFR
ALT: 17 U/L (ref 0–44)
AST: 24 U/L (ref 15–41)
Albumin: 3 g/dL — ABNORMAL LOW (ref 3.5–5.0)
Alkaline Phosphatase: 79 U/L (ref 38–126)
Anion gap: 9 (ref 5–15)
BUN: 15 mg/dL (ref 8–23)
CO2: 19 mmol/L — ABNORMAL LOW (ref 22–32)
Calcium: 7.7 mg/dL — ABNORMAL LOW (ref 8.9–10.3)
Chloride: 104 mmol/L (ref 98–111)
Creatinine, Ser: 1.1 mg/dL — ABNORMAL HIGH (ref 0.44–1.00)
GFR, Estimated: 47 mL/min — ABNORMAL LOW (ref >60)
Glucose, Bld: 89 mg/dL (ref 70–99)
Potassium: 4.1 mmol/L (ref 3.5–5.1)
Sodium: 132 mmol/L — ABNORMAL LOW (ref 135–145)
Total Bilirubin: 0.8 mg/dL (ref 0.0–1.2)
Total Protein: 5.6 g/dL — ABNORMAL LOW (ref 6.5–8.1)

## 2023-08-13 LAB — CBC WITH DIFFERENTIAL/PLATELET
Abs Immature Granulocytes: 0.01 K/uL (ref 0.00–0.07)
Basophils Absolute: 0 K/uL (ref 0.0–0.1)
Basophils Relative: 1 %
Eosinophils Absolute: 0.1 K/uL (ref 0.0–0.5)
Eosinophils Relative: 3 %
HCT: 29.4 % — ABNORMAL LOW (ref 36.0–46.0)
Hemoglobin: 8.7 g/dL — ABNORMAL LOW (ref 12.0–15.0)
Immature Granulocytes: 0 %
Lymphocytes Relative: 30 %
Lymphs Abs: 1.3 K/uL (ref 0.7–4.0)
MCH: 21.6 pg — ABNORMAL LOW (ref 26.0–34.0)
MCHC: 29.6 g/dL — ABNORMAL LOW (ref 30.0–36.0)
MCV: 73 fL — ABNORMAL LOW (ref 80.0–100.0)
Monocytes Absolute: 0.4 K/uL (ref 0.1–1.0)
Monocytes Relative: 8 %
Neutro Abs: 2.6 K/uL (ref 1.7–7.7)
Neutrophils Relative %: 58 %
Platelets: 104 K/uL — ABNORMAL LOW (ref 150–400)
RBC: 4.03 MIL/uL (ref 3.87–5.11)
RDW: 16.7 % — ABNORMAL HIGH (ref 11.5–15.5)
Smear Review: NORMAL
WBC: 4.4 K/uL (ref 4.0–10.5)
nRBC: 0 % (ref 0.0–0.2)

## 2023-08-13 LAB — CBG MONITORING, ED: Glucose-Capillary: 90 mg/dL (ref 70–99)

## 2023-08-13 MED ORDER — SODIUM CHLORIDE 0.9 % IV SOLN: Freq: Once | INTRAVENOUS | Status: AC

## 2023-08-13 MED ORDER — SODIUM CHLORIDE 0.9 % IV BOLUS
1000.0000 mL | Freq: Once | INTRAVENOUS | Status: AC
  Administered 2023-08-13: 1000 mL via INTRAVENOUS

## 2023-08-13 MED ORDER — SODIUM CHLORIDE 0.9 % IV SOLN: INTRAVENOUS | Status: DC

## 2023-08-13 NOTE — Discharge Instructions (Addendum)
 Be sure to follow-up with your physician in about 1 week for repeat evaluation and to discuss your medication regimen.  Today's evaluation has been reassuring, with unremarkable labs, x-ray, but given today's episode, concern for dehydration is important, as above to follow-up with your physician.

## 2023-08-13 NOTE — ED Provider Notes (Signed)
 [place] EMERGENCY DEPARTMENT AT [facility] Provider Note   CSN: [id number] Arrival date & time: 08/13/23  1025     Patient presents with: Loss of Consciousness and Hypotension   Kristine Sherman is a 89 y.o. female.   HPI Patient presents with concern for an episode of syncope.  She was accompanied by EMS.  EMS was notified due to patient's watch calling for assistance.  Patient notes that she woke in her usual state of health, did not eat or drink a typical amount, was standing upright, preparing a meal prior to going for a walk, when she felt lightheaded, had syncope. No chest pain before or afterwards.  EMS reports patient was hypotensive supine, more so upright, received approximately 150 mL fluids prior to arrival, improved in terms of tachycardia patient.  Patient currently denies any pain, states that she has been in her usual state of health.     Prior to Admission medications   Medication Sig Start Date End Date Taking? Authorizing Provider  acetaminophen  (TYLENOL ) 325 MG tablet Take 650 mg by mouth every 6 (six) hours as needed for headache.    [provider]  amLODipine  (NORVASC ) 5 MG tablet Take 1 tablet (5 mg total) by mouth daily. 07/22/23   Landy Barnie RAMAN, NP  aspirin  EC 81 MG tablet Take 81 mg by mouth at bedtime. Swallow whole.    [provider]  atorvastatin  (LIPITOR) 40 MG tablet Take 1 tablet (40 mg total) by mouth every morning. 07/22/23   Landy Barnie RAMAN, NP  brimonidine  (ALPHAGAN ) 0.2 % ophthalmic solution Place 1 drop into both eyes 3 (three) times daily. 07/22/23   Landy Barnie RAMAN, NP  Cyanocobalamin (VITAMIN B-12 SL) Place 500 mcg under the tongue every morning. 1 spray - 500 mcg    [provider]  dorzolamide -timolol  (COSOPT ) 2-0.5 % ophthalmic solution Place 1 drop into both eyes 2 (two) times daily. 07/22/23   Landy Barnie RAMAN, NP  gabapentin  (NEURONTIN ) 100 MG capsule Take 2 capsules (200 mg total) by mouth at bedtime.  07/22/23   Landy Barnie RAMAN, NP  levothyroxine  (SYNTHROID ) 50 MCG tablet Take 1 tablet (50 mcg total) by mouth daily. 07/22/23   Landy Barnie RAMAN, NP  meclizine  (ANTIVERT ) 12.5 MG tablet Take 1 tablet (12.5 mg total) by mouth 3 (three) times daily as needed for dizziness. 07/22/23   Landy Barnie RAMAN, NP  Netarsudil -Latanoprost  (ROCKLATAN ) 0.02-0.005 % SOLN Place 1 drop into both eyes at bedtime. 07/22/23   Landy Barnie RAMAN, NP  Polyethyl Glycol-Propyl Glycol (SYSTANE PRESERVATIVE FREE OP) Place 1 drop into both eyes 4 (four) times daily as needed (dry eyes). Systane Complete Preservative Free    [provider]    Allergies: Oxycodone-aspirin  and Sulfa antibiotics    Review of Systems  Updated Vital Signs BP (!) 144/91   Pulse 62   Temp 97.8 F (36.6 C) (Oral)   Resp 16   Ht 5' 5 (1.651 m)   Wt 56.9 kg   SpO2 100%   BMI 20.88 kg/m   Physical Exam Vitals and nursing note reviewed.  Constitutional:      General: She is not in acute distress.    Appearance: She is well-developed.  HENT:     Head: Normocephalic and atraumatic.  Eyes:     Conjunctiva/sclera: Conjunctivae normal.  Cardiovascular:     Rate and Rhythm: Normal rate and regular rhythm.  Pulmonary:     Effort: Pulmonary effort is normal. No respiratory  distress.     Breath sounds: Normal breath sounds. No stridor.  Abdominal:     General: There is no distension.  Skin:    General: Skin is warm and dry.  Neurological:     Mental Status: She is alert and oriented to person, place, and time.     Cranial Nerves: No cranial nerve deficit.  Psychiatric:        Mood and Affect: Mood normal.     (all labs ordered are listed, but only abnormal results are displayed) Labs Reviewed  COMPREHENSIVE METABOLIC PANEL WITH GFR - Abnormal; Notable for the following components:      Result Value   Sodium 132 (*)    CO2 19 (*)    Creatinine, Ser 1.10 (*)    Calcium  7.7 (*)    Total Protein 5.6 (*)    Albumin 3.0 (*)     GFR, Estimated 47 (*)    All other components within normal limits  CBC WITH DIFFERENTIAL/PLATELET - Abnormal; Notable for the following components:   Hemoglobin 8.7 (*)    HCT 29.4 (*)    MCV 73.0 (*)    MCH 21.6 (*)    MCHC 29.6 (*)    RDW 16.7 (*)    Platelets 104 (*)    All other components within normal limits  PATHOLOGIST SMEAR REVIEW  CBG MONITORING, ED  CBG MONITORING, ED    EKG: EKG Interpretation Date/Time:  Thursday August 13 2023 10:34:17 EDT Ventricular Rate:  65 PR Interval:  197 QRS Duration:  85 QT Interval:  398 QTC Calculation: 414 R Axis:   -24  Text Interpretation: ATRIAL PACED RHYTHM Confirmed by Garrick Charleston [phone]) on 08/13/2023 11:10:47 AM  Radiology: [facility] Chest Port 1 View Result Date: 08/13/2023 CLINICAL DATA:  Shortness of breath.  Syncope. EXAM: PORTABLE CHEST 1 VIEW COMPARISON:  Chest radiograph dated 07/12/2023. FINDINGS: No focal consolidation, pleural effusion or pneumothorax. The cardiac silhouette is within limits. Left pectoral pacemaker device. No acute osseous pathology. IMPRESSION: No active disease. Electronically Signed   By: Vanetta Chou M.D.   On: 08/13/2023 11:27     Procedures   Medications Ordered in the ED  sodium chloride  0.9 % bolus 1,000 mL (0 mLs Intravenous Stopped 08/13/23 1241)    And  0.9 %  sodium chloride  infusion (0 mLs Intravenous Stopped 08/13/23 1550)  0.9 %  sodium chloride  infusion (0 mLs Intravenous Stopped 08/13/23 1242)                                    Medical Decision Making Elderly female with pacemaker presents after episode of syncope.  Given EMS vital signs some consideration of hypovolemia contributing to the patient's episode of pacemaker dysfunction, arrhythmia, infection all considerations as well. Patient received fluids, monitor. Cardiac 65 paced abnormal Pulse ox 99% room air normal  Amount and/or Complexity of Data Reviewed Independent Historian: EMS External Data Reviewed:  notes. Labs: ordered. Decision-making details documented in ED Course. Radiology: ordered and independent interpretation performed. Decision-making details documented in ED Course. ECG/medicine tests: ordered and independent interpretation performed. Decision-making details documented in ED Course.  Risk Prescription drug management. Decision regarding hospitalization. Diagnosis or treatment significantly limited by social determinants of health.   Update: Patient accompanied by family member.  Update: Patient eating, drinking, feeling better, vital signs normal, labs reassuring, rest discussed at length.  Patient has been monitored for hours has  had no evidence for arrhythmia, pacemaker seems to be functioning appropriately, she has no ongoing complaints, suspicion for dehydration during an episode of orthostasis given her generally reassuring exam here patient, family and I discussed importance of following up with primary care, particularly for medication reconciliation as well as discussed importance of fluids       Final diagnoses:  Dehydration  Syncope and collapse    ED Discharge Orders     None          Garrick Charleston, MD 08/13/23 1622

## 2023-08-13 NOTE — ED Triage Notes (Addendum)
 Pt bib [facility] when iwatch altered 911 for a syncopal episode after having her morning walk around neighborhood  No pain noted  92/64 initial Sitting up  62/58 standing up 82 weak and thready  Given of fluids Swelling to bilateral legs this week as well

## 2023-08-14 LAB — PATHOLOGIST SMEAR REVIEW

## 2023-08-18 ENCOUNTER — Ambulatory Visit (INDEPENDENT_AMBULATORY_CARE_PROVIDER_SITE_OTHER): Payer: Medicare PPO

## 2023-08-18 DIAGNOSIS — I495 Sick sinus syndrome: Secondary | ICD-10-CM | POA: Diagnosis not present

## 2023-08-19 ENCOUNTER — Ambulatory Visit: Payer: Self-pay

## 2023-08-19 LAB — CUP PACEART REMOTE DEVICE CHECK
Date Time Interrogation Session: 20250805100637
Implantable Lead Connection Status: 753985
Implantable Lead Connection Status: 753985
Implantable Lead Implant Date: 20161003
Implantable Lead Implant Date: 20161003
Implantable Lead Location: 753859
Implantable Lead Location: 753860
Implantable Lead Model: 377
Implantable Lead Model: 377
Implantable Lead Serial Number: 49295355
Implantable Lead Serial Number: 49335002
Implantable Pulse Generator Implant Date: 20161003
Pulse Gen Model: 394929
Pulse Gen Serial Number: 68595075

## 2023-08-19 NOTE — Telephone Encounter (Signed)
 Schedule sooner appointment if symptoms worsen.

## 2023-08-19 NOTE — Telephone Encounter (Signed)
 FYI Only or Action Required?: Action required by provider: request for appointment.  Patient was last seen in primary care on 07/22/2023 by Landy Barnie RAMAN, NP.  Called Nurse Triage reporting Hypertension.  Symptoms began several weeks ago.  Interventions attempted: Nothing.  Symptoms are: unchanged.  Triage Disposition: See PCP When Office is Open (Within 3 Days)  Patient/caregiver understands and will follow disposition?: YesCopied from CRM [id number]. Topic: Clinical - Red Word Triage >> Aug 19, 2023  2:24 PM Miquel SAILOR wrote: Red Word that prompted transfer to Nurse Triage: BP as of 08/06 146/101-Legs and swelling both sides/nauseating Reason for Disposition  Systolic BP >= 160 OR Diastolic >= 100  Answer Assessment - Initial Assessment Questions Pt was on amlodipine  but dropped too low and was takne off. Last several weeks pressure was been higher. Denies SOB and chest pain, headache. Pt refused appt today. Pt only wants to see PCP. Pt stated she already has appt for 8/8 and stated ill just wait.      1. BLOOD PRESSURE: What is your blood pressure? Did you take at least two measurements 5 minutes apart?     146/101; 136/89 2. ONSET: When did you take your blood pressure?     This morning and now 3. HOW: How did you take your blood pressure? (e.g., automatic home BP monitor, visiting nurse)     Automatic  4. HISTORY: Do you have a history of high blood pressure?     no 5. MEDICINES: Are you taking any medicines for blood pressure? Have you missed any doses recently?     Not currently taking  6. OTHER SYMPTOMS: Do you have any symptoms? (e.g., blurred vision, chest pain, difficulty breathing, headache, weakness) Legs swelling on feet and ankles, and nausea  Protocols used: Blood Pressure - High-A-AH

## 2023-08-21 ENCOUNTER — Ambulatory Visit: Payer: Self-pay | Admitting: Cardiology

## 2023-08-21 ENCOUNTER — Encounter: Payer: Self-pay | Admitting: Family

## 2023-08-21 ENCOUNTER — Ambulatory Visit: Admitting: Family

## 2023-08-21 VITALS — BP 128/84 | HR 79 | Temp 97.7°F | Resp 18 | Ht 65.0 in | Wt 123.8 lb

## 2023-08-21 DIAGNOSIS — Z0000 Encounter for general adult medical examination without abnormal findings: Secondary | ICD-10-CM

## 2023-08-21 NOTE — Progress Notes (Signed)
 Subjective:   Kristine Sherman is a 89 y.o. female who presents for Medicare Annual (Subsequent) preventive examination.  Visit Complete: In person  Patient Medicare AWV questionnaire was completed by the patient on 08/21/2023; I have confirmed that all information answered by patient is correct and no changes since this date.  Cardiac Risk Factors include: advanced age (>66men, >45 women)     Objective:    Today's Vitals   08/21/23 1403  BP: 128/84  Pulse: 79  Resp: 18  Temp: 97.7 F (36.5 C)  SpO2: 99%  Weight: 123 lb 12.8 oz (56.2 kg)  Height: 5' 5 (1.651 m)   Body mass index is 20.6 kg/m.     08/21/2023    1:48 PM 08/13/2023   10:37 AM 07/22/2023    9:03 AM 07/12/2023    8:32 PM 01/02/2023    1:22 PM 09/18/2022    2:33 PM 07/22/2022    3:13 PM  Advanced Directives  Does Patient Have a Medical Advance Directive? Yes Yes No No No Yes Yes  Type of Advance Directive Living will Healthcare Power of [employer] [place];Living will;Out of facility DNR (pink MOST or yellow form) Healthcare Power of [place];Living will;Out of facility DNR (pink MOST or yellow form)  Does patient want to make changes to medical advance directive? No - Patient declined     No - Patient declined   Copy of Healthcare Power of Attorney in Chart?      No - copy requested No - copy requested  Would patient like information on creating a medical advance directive?   No - Patient declined  No - Patient declined      Current Medications (verified) Outpatient Encounter Medications as of 08/21/2023  Medication Sig   acetaminophen  (TYLENOL ) 325 MG tablet Take 650 mg by mouth every 6 (six) hours as needed for headache.   aspirin  EC 81 MG tablet Take 81 mg by mouth at bedtime. Swallow whole.   atorvastatin  (LIPITOR) 40 MG tablet Take 1 tablet (40 mg total) by mouth every morning.   brimonidine  (ALPHAGAN ) 0.2 % ophthalmic solution Place 1 drop into both eyes 3 (three) times daily.    Cyanocobalamin (VITAMIN B-12 SL) Place 500 mcg under the tongue every morning. 1 spray - 500 mcg   cycloSPORINE (RESTASIS) 0.05 % ophthalmic emulsion Place 1 drop into both eyes 2 (two) times daily.   dorzolamide -timolol  (COSOPT ) 2-0.5 % ophthalmic solution Place 1 drop into both eyes 2 (two) times daily.   levothyroxine  (SYNTHROID ) 50 MCG tablet Take 1 tablet (50 mcg total) by mouth daily.   meclizine  (ANTIVERT ) 12.5 MG tablet Take 1 tablet (12.5 mg total) by mouth 3 (three) times daily as needed for dizziness.   Netarsudil -Latanoprost  (ROCKLATAN ) 0.02-0.005 % SOLN Place 1 drop into both eyes at bedtime.   Olopatadine HCl (PATADAY OP) Place into both eyes as needed.   Polyethyl Glycol-Propyl Glycol (SYSTANE PRESERVATIVE FREE OP) Place 1 drop into both eyes 4 (four) times daily as needed (dry eyes). Systane Complete Preservative Free   [DISCONTINUED] amLODipine  (NORVASC ) 5 MG tablet Take 1 tablet (5 mg total) by mouth daily. (Patient not taking: Reported on 08/21/2023)   [DISCONTINUED] gabapentin  (NEURONTIN ) 100 MG capsule Take 2 capsules (200 mg total) by mouth at bedtime. (Patient not taking: Reported on 08/21/2023)   No facility-administered encounter medications on file as of 08/21/2023.    Allergies (verified) Oxycodone-aspirin  and Sulfa antibiotics   History: Past Medical History:  Diagnosis  Date   CKD (chronic kidney disease)    Glaucoma    Hyperlipidemia    Hypertension    Hypothyroidism    Neuropathy    Pacemaker    Seasonal allergies    Vertigo    Vitamin B12 deficiency    Past Surgical History:  Procedure Laterality Date   ABDOMINAL HYSTERECTOMY     DG GALL BLADDER     Family History  Problem Relation Age of Onset   Rheum arthritis Mother    Breast cancer Sister    Social History   Socioeconomic History   Marital status: Divorced    Spouse name: Not on file   Number of children: Not on file   Years of education: Not on file   Highest education level: Master's  degree (e.g., MA, MS, MEng, MEd, MSW, MBA)  Occupational History   Not on file  Tobacco Use   Smoking status: Never   Smokeless tobacco: Never  Substance and Sexual Activity   Alcohol use: Yes    Alcohol/week: 1.0 standard drink of alcohol    Types: 1 Glasses of wine per week    Comment: monthly   Drug use: Never   Sexual activity: Not Currently  Other Topics Concern   Not on file  Social History Narrative   Not on file   Social Drivers of Health   Financial Resource Strain: Low Risk  (12/29/2022)   Overall Financial Resource Strain (CARDIA)    Difficulty of Paying Living Expenses: Not hard at all  Food Insecurity: No Food Insecurity (08/21/2023)   Hunger Vital Sign    Worried About Running Out of Food in the Last Year: Never true    Ran Out of Food in the Last Year: Never true  Transportation Needs: No Transportation Needs (08/21/2023)   PRAPARE - Administrator, Civil Service (Medical): No    Lack of Transportation (Non-Medical): No  Physical Activity: Insufficiently Active (06/26/2022)   Exercise Vital Sign    Days of Exercise per Week: 3 days    Minutes of Exercise per Session: 30 min  Stress: No Stress Concern Present (06/26/2022)   [employer] of Occupational Health - Occupational Stress Questionnaire    Feeling of Stress : Not at all  Social Connections: Unknown (12/29/2022)   Social Connection and Isolation Panel    Frequency of Communication with Friends and Family: More than three times a week    Frequency of Social Gatherings with Friends and Family: Patient declined    Attends Religious Services: Patient declined    Database administrator or Organizations: Yes    Attends Engineer, structural: Not on file    Marital Status: Divorced    Tobacco Counseling Counseling given: Not Answered   Clinical Intake:  Pre-visit preparation completed: No  Pain : No/denies pain   BMI - recorded: 20.6 Nutritional Status: BMI of 19-24   Normal Nutritional Risks: None Diabetes: No  How often do you need to have someone help you when you read instructions, pamphlets, or other written materials from your doctor or pharmacy?: 3 - Sometimes (with small print) What is the last grade level you completed in school?: Master's degree  Interpreter Needed?: No   Activities of Daily Living    08/21/2023    2:02 PM  In your present state of health, do you have any difficulty performing the following activities:  Hearing? 0  Vision? 0  Difficulty concentrating or making decisions? 0  Walking or  climbing stairs? 0  Dressing or bathing? 0  Doing errands, shopping? 0  Preparing Food and eating ? N  Using the Toilet? N  In the past six months, have you accidently leaked urine? N  Do you have problems with loss of bowel control? N  Managing your Medications? N  Managing your Finances? N  Housekeeping or managing your Housekeeping? N    Patient Care Team: Nohealani Medinger, Roxan BROCKS, NP as PCP - General (Family Medicine) Cindie Ole DASEN, MD as PCP - Electrophysiology (Cardiology)  Indicate any recent Medical Services you may have received from other than [facility] providers in the past year (date may be approximate).     Assessment:   This is a routine wellness examination for Laiklyn.  Hearing/Vision screen Hearing Screening - Comments:: No hearing issues pt has hearing aids. Vision Screening - Comments:: Eye exam June 2025 ( [facility] in [place]) pt has severe glaucoma   Goals Addressed             This Visit's Progress    Patient Stated       Continue to exercise as much as possible        Depression Screen    08/21/2023    1:49 PM 07/22/2023    9:02 AM 01/02/2023    1:21 PM 07/22/2022    3:15 PM 05/08/2022    3:16 PM 03/07/2022   11:14 AM  PHQ 2/9 Scores  PHQ - 2 Score 0 0 0 0 0 0  Exception Documentation    Other- indicate reason in comment box    Not completed    AWV      Fall Risk    08/21/2023    1:49  PM 07/22/2023    9:02 AM 01/02/2023    1:20 PM 09/18/2022    2:33 PM 07/22/2022    3:13 PM  Fall Risk   Falls in the past year? 0 0 0 0 0  Number falls in past yr: 0 0 0 0 0  Injury with Fall? 0 0 0 0 0  Risk for fall due to : No Fall Risks No Fall Risks  No Fall Risks No Fall Risks  Follow up Falls evaluation completed Falls evaluation completed  Falls evaluation completed;Education provided;Falls prevention discussed Falls evaluation completed    MEDICARE RISK AT HOME: Medicare Risk at Home Any stairs in or around the home?: Yes If so, are there any without handrails?: No Home free of loose throw rugs in walkways, pet beds, electrical cords, etc?: Yes Adequate lighting in your home to reduce risk of falls?: Yes Life alert?: No Use of a cane, walker or w/c?: Yes Grab bars in the bathroom?: Yes Shower chair or bench in shower?: Yes Elevated toilet seat or a handicapped toilet?: No  TIMED UP AND GO:  Was the test performed?  Yes  Length of time to ambulate 10 feet: 10 sec Gait slow and steady with assistive device    Cognitive Function:        08/21/2023    1:51 PM 07/22/2022    3:15 PM  6CIT Screen  What Year? 0 points 0 points  What month? 0 points 0 points  What time? 0 points 0 points  Count back from 20 0 points 0 points  Months in reverse 0 points 0 points  Repeat phrase 0 points 0 points  Total Score 0 points 0 points    Immunizations Immunization History  Administered Date(s) Administered  Fluad Quad(high Dose 65+) 10/18/2021   Fluad Trivalent(High Dose 65+) 09/18/2022   Influenza Split 10/19/2016, 10/27/2016, 10/22/2017, 09/29/2018   Influenza, High Dose Seasonal PF 09/11/2020   Influenza-Unspecified 10/27/2016   Moderna Covid-19 Vaccine Bivalent Booster 81yrs & up 10/16/2022   PFIZER(Purple Top)SARS-COV-2 Vaccination 01/19/2019, 02/07/2019, 10/11/2019   Pfizer(Comirnaty)Fall Seasonal Vaccine 12 years and older 11/18/2021   Pneumococcal Conjugate-13  10/22/2016   Pneumococcal Polysaccharide-23 02/01/2018   Tdap 07/29/2017   Zoster Recombinant(Shingrix) 01/20/2017    TDAP status: Up to date  Flu Vaccine status: Up to date  Pneumococcal vaccine status: Up to date  Covid-19 vaccine status: Information provided on how to obtain vaccines.   Qualifies for Shingles Vaccine? Yes   Zostavax completed No   Shingrix Completed?: No.    Education has been provided regarding the importance of this vaccine. Patient has been advised to call insurance company to determine out of pocket expense if they have not yet received this vaccine. Advised may also receive vaccine at local pharmacy or Health Dept. Verbalized acceptance and understanding.  Screening Tests Health Maintenance  Topic Date Due   Zoster Vaccines- Shingrix (2 of 2) 03/17/2017   COVID-19 Vaccine (6 - 2024-25 season) 12/11/2022   INFLUENZA VACCINE  08/14/2023   Medicare Annual Wellness (AWV)  08/20/2024   DTaP/Tdap/Td (2 - Td or Tdap) 07/30/2027   Pneumococcal Vaccine: 50+ Years  Completed   Hepatitis B Vaccines  Aged Out   HPV VACCINES  Aged Out   Meningococcal B Vaccine  Aged Out   DEXA SCAN  Discontinued    Health Maintenance  Health Maintenance Due  Topic Date Due   Zoster Vaccines- Shingrix (2 of 2) 03/17/2017   COVID-19 Vaccine (6 - 2024-25 season) 12/11/2022   INFLUENZA VACCINE  08/14/2023    Colorectal cancer screening: No longer required.   Mammogram status: No longer required due to advance age .  Bone Density status: Ordered N/A . Pt provided with contact info and advised to call to schedule appt.  Lung Cancer Screening: (Low Dose CT Chest recommended if Age 55-80 years, 20 pack-year currently smoking OR have quit w/in 15years.) does not qualify.   Lung Cancer Screening Referral: N/A   Additional Screening:  Hepatitis C Screening: does not qualify; Completed N/A   Vision Screening: Recommended annual ophthalmology exams for early detection of  glaucoma and other disorders of the eye. Is the patient up to date with their annual eye exam?  Yes  Who is the provider or what is the name of the office in which the patient attends annual eye exams? Dr.Moya  If pt is not established with a provider, would they like to be referred to a provider to establish care? No .   Dental Screening: Recommended annual dental exams for proper oral hygiene  Diabetic Foot Exam: Diabetic Foot Exam: Completed N/A   Community Resource Referral / Chronic Care Management: CRR required this visit?  No   CCM required this visit?  No     Plan:     I have personally reviewed and noted the following in the patient's chart:   Medical and social history Use of alcohol, tobacco or illicit drugs  Current medications and supplements including opioid prescriptions. Patient is not currently taking opioid prescriptions. Functional ability and status Nutritional status Physical activity Advanced directives List of other physicians Hospitalizations, surgeries, and ER visits in previous 12 months Vitals Screenings to include cognitive, depression, and falls Referrals and appointments  In addition, I have  reviewed and discussed with patient certain preventive protocols, quality metrics, and best practice recommendations. A written personalized care plan for preventive services as well as general preventive health recommendations were provided to patient.     Roxan JAYSON Plough, NP   08/23/2023   After Visit Summary: (In Person-Printed) AVS printed and given to the patient  Nurse Notes: Advised to get shingles vaccine at the pharmacy Yes

## 2023-08-21 NOTE — Patient Instructions (Signed)
 Kristine Sherman , Thank you for taking time to come for your Medicare Wellness Visit. I appreciate your ongoing commitment to your health goals. Please review the following plan we discussed and let me know if I can assist you in the future.   Screening recommendations/referrals: Colonoscopy : N/A  Mammogram :  Bone Density : Up to date  Recommended yearly ophthalmology/optometry visit for glaucoma screening and checkup Recommended yearly dental visit for hygiene and checkup  Vaccinations: Influenza vaccine- due annually in September/October Pneumococcal vaccine : Up to date  Tdap vaccine : Up to date  Shingles vaccine : Please get vaccine at the pharmacy     Advanced directives: Yes   Conditions/risks identified:  advanced age (>62men, >32 women)  Next appointment: 1 year    Preventive Care 80 Years and Older, Female Preventive care refers to lifestyle choices and visits with your health care provider that can promote health and wellness. What does preventive care include? A yearly physical exam. This is also called an annual well check. Dental exams once or twice a year. Routine eye exams. Ask your health care provider how often you should have your eyes checked. Personal lifestyle choices, including: Daily care of your teeth and gums. Regular physical activity. Eating a healthy diet. Avoiding tobacco and drug use. Limiting alcohol use. Practicing safe sex. Taking low-dose aspirin  every day. Taking vitamin and mineral supplements as recommended by your health care provider. What happens during an annual well check? The services and screenings done by your health care provider during your annual well check will depend on your age, overall health, lifestyle risk factors, and family history of disease. Counseling  Your health care provider may ask you questions about your: Alcohol use. Tobacco use. Drug use. Emotional well-being. Home and relationship well-being. Sexual  activity. Eating habits. History of falls. Memory and ability to understand (cognition). Work and work Astronomer. Reproductive health. Screening  You may have the following tests or measurements: Height, weight, and BMI. Blood pressure. Lipid and cholesterol levels. These may be checked every 5 years, or more frequently if you are over 44 years old. Skin check. Lung cancer screening. You may have this screening every year starting at age 72 if you have a 30-pack-year history of smoking and currently smoke or have quit within the past 15 years. Fecal occult blood test (FOBT) of the stool. You may have this test every year starting at age 70. Flexible sigmoidoscopy or colonoscopy. You may have a sigmoidoscopy every 5 years or a colonoscopy every 10 years starting at age 35. Hepatitis C blood test. Hepatitis B blood test. Sexually transmitted disease (STD) testing. Diabetes screening. This is done by checking your blood sugar (glucose) after you have not eaten for a while (fasting). You may have this done every 1-3 years. Bone density scan. This is done to screen for osteoporosis. You may have this done starting at age 47. Mammogram. This may be done every 1-2 years. Talk to your health care provider about how often you should have regular mammograms. Talk with your health care provider about your test results, treatment options, and if necessary, the need for more tests. Vaccines  Your health care provider may recommend certain vaccines, such as: Influenza vaccine. This is recommended every year. Tetanus, diphtheria, and acellular pertussis (Tdap, Td) vaccine. You may need a Td booster every 10 years. Zoster vaccine. You may need this after age 26. Pneumococcal 13-valent conjugate (PCV13) vaccine. One dose is recommended after age 31. Pneumococcal  polysaccharide (PPSV23) vaccine. One dose is recommended after age 40. Talk to your health care provider about which screenings and vaccines  you need and how often you need them. This information is not intended to replace advice given to you by your health care provider. Make sure you discuss any questions you have with your health care provider. Document Released: 01/26/2015 Document Revised: 09/19/2015 Document Reviewed: 10/31/2014 Elsevier Interactive Patient Education  2017 [employer].  Fall Prevention in the Home Falls can cause injuries. They can happen to people of all ages. There are many things you can do to make your home safe and to help prevent falls. What can I do on the outside of my home? Regularly fix the edges of walkways and driveways and fix any cracks. Remove anything that might make you trip as you walk through a door, such as a raised step or threshold. Trim any bushes or trees on the path to your home. Use bright outdoor lighting. Clear any walking paths of anything that might make someone trip, such as rocks or tools. Regularly check to see if handrails are loose or broken. Make sure that both sides of any steps have handrails. Any raised decks and porches should have guardrails on the edges. Have any leaves, snow, or ice cleared regularly. Use sand or salt on walking paths during winter. Clean up any spills in your garage right away. This includes oil or grease spills. What can I do in the bathroom? Use night lights. Install grab bars by the toilet and in the tub and shower. Do not use towel bars as grab bars. Use non-skid mats or decals in the tub or shower. If you need to sit down in the shower, use a plastic, non-slip stool. Keep the floor dry. Clean up any water that spills on the floor as soon as it happens. Remove soap buildup in the tub or shower regularly. Attach bath mats securely with double-sided non-slip rug tape. Do not have throw rugs and other things on the floor that can make you trip. What can I do in the bedroom? Use night lights. Make sure that you have a light by your bed that  is easy to reach. Do not use any sheets or blankets that are too big for your bed. They should not hang down onto the floor. Have a firm chair that has side arms. You can use this for support while you get dressed. Do not have throw rugs and other things on the floor that can make you trip. What can I do in the kitchen? Clean up any spills right away. Avoid walking on wet floors. Keep items that you use a lot in easy-to-reach places. If you need to reach something above you, use a strong step stool that has a grab bar. Keep electrical cords out of the way. Do not use floor polish or wax that makes floors slippery. If you must use wax, use non-skid floor wax. Do not have throw rugs and other things on the floor that can make you trip. What can I do with my stairs? Do not leave any items on the stairs. Make sure that there are handrails on both sides of the stairs and use them. Fix handrails that are broken or loose. Make sure that handrails are as long as the stairways. Check any carpeting to make sure that it is firmly attached to the stairs. Fix any carpet that is loose or worn. Avoid having throw rugs at the top  or bottom of the stairs. If you do have throw rugs, attach them to the floor with carpet tape. Make sure that you have a light switch at the top of the stairs and the bottom of the stairs. If you do not have them, ask someone to add them for you. What else can I do to help prevent falls? Wear shoes that: Do not have high heels. Have rubber bottoms. Are comfortable and fit you well. Are closed at the toe. Do not wear sandals. If you use a stepladder: Make sure that it is fully opened. Do not climb a closed stepladder. Make sure that both sides of the stepladder are locked into place. Ask someone to hold it for you, if possible. Clearly mark and make sure that you can see: Any grab bars or handrails. First and last steps. Where the edge of each step is. Use tools that help you  move around (mobility aids) if they are needed. These include: Canes. Walkers. Scooters. Crutches. Turn on the lights when you go into a dark area. Replace any light bulbs as soon as they burn out. Set up your furniture so you have a clear path. Avoid moving your furniture around. If any of your floors are uneven, fix them. If there are any pets around you, be aware of where they are. Review your medicines with your doctor. Some medicines can make you feel dizzy. This can increase your chance of falling. Ask your doctor what other things that you can do to help prevent falls. This information is not intended to replace advice given to you by your health care provider. Make sure you discuss any questions you have with your health care provider. Document Released: 10/26/2008 Document Revised: 06/07/2015 Document Reviewed: 02/03/2014 Elsevier Interactive Patient Education  2017 [employer].

## 2023-09-20 NOTE — Progress Notes (Deleted)
  Cardiology Office Note:  .   Date:  09/20/2023  ID:  Kristine Sherman, DOB Apr 29, 1928, MRN [id number] PCP: Kristine Roxan BROCKS, NP   [facility]Care Providers Cardiologist:  None Electrophysiologist:  OLE ONEIDA HOLTS, MD {  History of Present Illness: .   Kristine Sherman is a 89 y.o. female w/PMHx of  HTN, HLD, hypothyroidism SSSx w/PPM  She saw Dr. HOLTS 07/31/22, doing well, walking regularly. No changes made.  ER visit 08/13/23, syncope (EMS alerted by her watch), by record, reported that  EMS reports patient was hypotensive supine, more so upright, received approximately 150 mL fluids prior to arrival, improved in terms of tachycardia patient Felt episode was 2/2 volume depletion   K+ 4.1 BUN/Creat 16/1.10 (about baseline) WBC 4.4 H/H 8.7/29  (baseline HGB is ~ 10) Plts 104  Saw PMD service 08/21/23, described as an annual medicare visit I dont see discussion of anemia syncope  Today's visit is scheduled as an annual device visit ROS:   Devic only *** implanted elsewhere? *** anemic....   Device information Biotronik dual chamber PPM implanted 10/16/2014  Studies Reviewed: SABRA    EKG done 08/13/23 and reviewed by myself:  A paced/V sensed 65bpm, no acute/ischemic changes  DEVICE interrogation done today and reviewed by myself *** Battery and lead measurements are good ***   Risk Assessment/Calculations:    Physical Exam:   VS:  There were no vitals taken for this visit.   Wt Readings from Last 3 Encounters:  08/21/23 123 lb 12.8 oz (56.2 kg)  08/13/23 125 lb 8 oz (56.9 kg)  07/22/23 125 lb 9.6 oz (57 kg)    GEN: Well nourished, well developed in no acute distress NECK: No JVD; No carotid bruits CARDIAC: ***RRR, no murmurs, rubs, gallops RESPIRATORY:  *** CTA b/l without rales, wheezing or rhonchi  ABDOMEN: Soft, non-tender, non-distended EXTREMITIES: *** No edema; No deformity   PPM site: *** is stable, no thinning, fluctuation, tethering  ASSESSMENT AND  PLAN: .    PPM *** intact function *** no programming changes made  Syncope Responded well to IVF Anemia looks new for her ***     {Are you ordering a CV Procedure (e.g. stress test, cath, DCCV, TEE, etc)?   Press F2        :[id number]}     Dispo: ***  Signed, Kristine Macario Arthur, PA-C

## 2023-09-21 ENCOUNTER — Ambulatory Visit: Admitting: Physician Assistant

## 2023-10-12 NOTE — Progress Notes (Signed)
 Remote PPM Transmission

## 2023-10-26 ENCOUNTER — Encounter: Admitting: Physician Assistant

## 2023-10-26 NOTE — Progress Notes (Unsigned)
  Cardiology Office Note:  .   Date:  10/26/2023  ID:  Kristine Sherman, DOB 27-Aug-1928, MRN [id number] PCP: Kristine Roxan BROCKS, NP  [facility] Providers Cardiologist:  None Electrophysiologist:  Kristine ONEIDA HOLTS, MD {  History of Present Illness: .   Kristine Sherman is a 89 y.o. female w/PMHx of  HTN, HLD, hypothyroidism SSX w/PPM  She saw Dr. HOLTS 07/31/22, doing well, walking 3-4x/week! Device functioning normally No changes were made   Today's visit is scheduled as an annual device visit ROS:   *** symptoms *** device *** PMD?  Device information Biotronik dual chamber PPM implanted 10/16/2014  Studies Reviewed: Kristine Sherman    EKG not done today 08/13/23, was personally reviewed: APaced 65bpm  DEVICE interrogation done today and reviewed by myself *** Battery and lead measurements are good ***    Risk Assessment/Calculations:    Physical Exam:   VS:  There were no vitals taken for this visit.   Wt Readings from Last 3 Encounters:  08/21/23 123 lb 12.8 oz (56.2 kg)  08/13/23 125 lb 8 oz (56.9 kg)  07/22/23 125 lb 9.6 oz (57 kg)    GEN: Well nourished, well developed in no acute distress NECK: No JVD; No carotid bruits CARDIAC: ***RRR, no murmurs, rubs, gallops RESPIRATORY:  *** CTA b/l without rales, wheezing or rhonchi  ABDOMEN: Soft, non-tender, non-distended EXTREMITIES: *** No edema; No deformity   PPM site: *** is stable, no thinning, fluctuation, tethering  ASSESSMENT AND PLAN: .    PPM *** intact function *** no programming changes made     {Are you ordering a CV Procedure (e.g. stress test, cath, DCCV, TEE, etc)?   Press F2        :[id number]}     Dispo: ***  Signed, Charlies Macario Arthur, PA-C

## 2023-10-28 ENCOUNTER — Encounter: Payer: Self-pay | Admitting: Physician Assistant

## 2023-10-28 ENCOUNTER — Ambulatory Visit: Attending: Cardiovascular Disease | Admitting: Cardiology

## 2023-10-28 VITALS — BP 129/79 | HR 79 | Ht 65.0 in | Wt 126.0 lb

## 2023-10-28 DIAGNOSIS — R002 Palpitations: Secondary | ICD-10-CM | POA: Diagnosis not present

## 2023-10-28 DIAGNOSIS — I495 Sick sinus syndrome: Secondary | ICD-10-CM | POA: Diagnosis not present

## 2023-10-28 DIAGNOSIS — I4719 Other supraventricular tachycardia: Secondary | ICD-10-CM | POA: Diagnosis not present

## 2023-10-28 NOTE — Progress Notes (Signed)
  Electrophysiology Office Note:   ID:  Ralyn Stlaurent, DOB [birth year], MRN [id number]  Primary Cardiologist: None Electrophysiologist: OLE ONEIDA HOLTS, MD      History of Present Illness:   Kristine Sherman is a 89 y.o. female with h/o sick sinus syndrome s/p PPM, hypertension, hyperlipidemia, hypothyroidism seen today with her son for routine electrophysiology followup.   Since last being seen in our clinic the patient reports doing well. She regularly goes for a 20 min walk (at least 3 times per week).  Patient reports noticing very brief episodes of palpitations at night over the past few weeks. Denies associated symptoms like chest tightness and shortness of breath. she otherwise denies PND, orthopnea, nausea, vomiting, dizziness, syncope, edema, weight gain, or early satiety.   Review of systems complete and found to be negative unless listed in HPI.   EP Information / Studies Reviewed:    EKG is not ordered today. EKG from 08/13/23 reviewed which showed atrial paced ventricular sensed rhythm. HR 65bpm.       PPM Interrogation-  reviewed in detail today,  See PACEART report.  Arrhythmia/Device History BIO PPM   Physical Exam:   VS:  BP 129/79 (BP Location: Right Arm, Patient Position: Sitting, Cuff Size: Normal)   Pulse 79   Ht 5' 5 (1.651 m)   Wt 126 lb (57.2 kg)   SpO2 99%   BMI 20.97 kg/m    Wt Readings from Last 3 Encounters:  10/28/23 126 lb (57.2 kg)  08/21/23 123 lb 12.8 oz (56.2 kg)  08/13/23 125 lb 8 oz (56.9 kg)     GEN: No acute distress  NECK: No JVD; No carotid bruits CARDIAC: Regular rate and rhythm, no murmurs, rubs, gallops RESPIRATORY:  Clear to auscultation without rales, wheezing or rhonchi  ABDOMEN: Soft, non-tender, non-distended EXTREMITIES:  No edema; No deformity   ASSESSMENT AND PLAN:    Sick sinus syndrome s/p Biotronik PPM  Normal PPM function. AP/VP on presentation. Atrial pacing dependent. Stable atrial and ventricular lead impedance,  ventricular sensing, atrial and ventricular thresholds.  See Pace Art report No changes today  Palpitations Patient reporting nocturnal palpitations. Device report shows one recent episode of brief atrial driven tachycardia, 4 second duration, 09/28/23 (patient states this would have been before she began noticing palpitations). No documented arrhythmias since she began noticing symptoms. No afib or other sustained arrhythmia. 0% PVC burden. Patient encouraged to call the office if symptoms increase in frequency or other symptoms develop.      Disposition:   Follow up with Dr. Almetta in 12 months  Signed, Artist Pouch, PA-C

## 2023-10-28 NOTE — Patient Instructions (Signed)
 Medication Instructions:   Your physician recommends that you continue on your current medications as directed. Please refer to the Current Medication list given to you today.   *If you need a refill on your cardiac medications before your next appointment, please call your pharmacy*   Lab Work: NONE ORDERED  TODAY     If you have labs (blood work) drawn today and your tests are completely normal, you will receive your results only by: MyChart Message (if you have MyChart) OR A paper copy in the mail If you have any lab test that is abnormal or we need to change your treatment, we will call you to review the results.  Testing/Procedures: NONE ORDERED  TODAY    Follow-Up: At [facility], you and your health needs are our priority.  As part of our continuing mission to provide you with exceptional heart care, our providers are all part of one team.  This team includes your primary Cardiologist (physician) and Advanced Practice Providers or APPs (Physician Assistants and Nurse Practitioners) who all work together to provide you with the care you need, when you need it.  Your next appointment:    1 year(s)   Provider:   You may see Boyce Byes, MD or one of the following Advanced Practice Providers on your designated Care Team:   Mertha Abrahams, [place]     We recommend signing up for the patient portal called "MyChart".  Sign up information is provided on this After Visit Summary.  MyChart is used to connect with patients for Virtual Visits (Telemedicine).  Patients are able to view lab/test results, encounter notes, upcoming appointments, etc.  Non-urgent messages can be sent to your provider as well.   To learn more about what you can do with MyChart, go to [email or web address].   Other Instructions

## 2023-11-06 DIAGNOSIS — Z961 Presence of intraocular lens: Secondary | ICD-10-CM | POA: Diagnosis not present

## 2023-11-06 DIAGNOSIS — H401133 Primary open-angle glaucoma, bilateral, severe stage: Secondary | ICD-10-CM | POA: Diagnosis not present

## 2023-11-06 DIAGNOSIS — H04123 Dry eye syndrome of bilateral lacrimal glands: Secondary | ICD-10-CM | POA: Diagnosis not present

## 2023-11-06 DIAGNOSIS — H26493 Other secondary cataract, bilateral: Secondary | ICD-10-CM | POA: Diagnosis not present

## 2023-11-17 ENCOUNTER — Ambulatory Visit: Payer: Medicare PPO

## 2023-11-17 DIAGNOSIS — I495 Sick sinus syndrome: Secondary | ICD-10-CM

## 2023-11-17 LAB — CUP PACEART REMOTE DEVICE CHECK
Battery Remaining Percentage: 35 %
Brady Statistic AP VP Percent: 1 %
Brady Statistic AP VS Percent: 97 %
Brady Statistic AS VP Percent: 0 %
Brady Statistic AS VS Percent: 2 %
Brady Statistic RA Percent Paced: 98 %
Brady Statistic RV Percent Paced: 1 %
Date Time Interrogation Session: 20251104001735
Implantable Lead Connection Status: 753985
Implantable Lead Connection Status: 753985
Implantable Lead Implant Date: 20161003
Implantable Lead Implant Date: 20161003
Implantable Lead Location: 753859
Implantable Lead Location: 753860
Implantable Lead Model: 377
Implantable Lead Model: 377
Implantable Lead Serial Number: 49295355
Implantable Lead Serial Number: 49335002
Implantable Pulse Generator Implant Date: 20161003
Lead Channel Impedance Value: 488 Ohm
Lead Channel Impedance Value: 507 Ohm
Lead Channel Pacing Threshold Amplitude: 0.8 V
Lead Channel Pacing Threshold Amplitude: 0.8 V
Lead Channel Pacing Threshold Pulse Width: 0.4 ms
Lead Channel Pacing Threshold Pulse Width: 0.4 ms
Lead Channel Setting Pacing Amplitude: 2.4 V
Lead Channel Setting Pacing Amplitude: 2.4 V
Lead Channel Setting Pacing Pulse Width: 0.4 ms
Pulse Gen Model: 394929
Pulse Gen Serial Number: 68595075

## 2023-11-19 DIAGNOSIS — H409 Unspecified glaucoma: Secondary | ICD-10-CM | POA: Diagnosis not present

## 2023-11-19 DIAGNOSIS — N1831 Chronic kidney disease, stage 3a: Secondary | ICD-10-CM | POA: Diagnosis not present

## 2023-11-19 DIAGNOSIS — I129 Hypertensive chronic kidney disease with stage 1 through stage 4 chronic kidney disease, or unspecified chronic kidney disease: Secondary | ICD-10-CM | POA: Diagnosis not present

## 2023-11-19 DIAGNOSIS — E039 Hypothyroidism, unspecified: Secondary | ICD-10-CM | POA: Diagnosis not present

## 2023-11-19 DIAGNOSIS — M1990 Unspecified osteoarthritis, unspecified site: Secondary | ICD-10-CM | POA: Diagnosis not present

## 2023-11-19 DIAGNOSIS — G629 Polyneuropathy, unspecified: Secondary | ICD-10-CM | POA: Diagnosis not present

## 2023-11-19 DIAGNOSIS — M3500 Sicca syndrome, unspecified: Secondary | ICD-10-CM | POA: Diagnosis not present

## 2023-11-19 DIAGNOSIS — I700 Atherosclerosis of aorta: Secondary | ICD-10-CM | POA: Diagnosis not present

## 2023-11-19 DIAGNOSIS — E785 Hyperlipidemia, unspecified: Secondary | ICD-10-CM | POA: Diagnosis not present

## 2023-11-20 NOTE — Progress Notes (Signed)
 Remote PPM Transmission

## 2023-11-23 ENCOUNTER — Ambulatory Visit: Payer: Self-pay | Admitting: Cardiology

## 2023-11-27 LAB — CUP PACEART INCLINIC DEVICE CHECK
Date Time Interrogation Session: 20251015161654
Implantable Lead Connection Status: 753985
Implantable Lead Connection Status: 753985
Implantable Lead Implant Date: 20161003
Implantable Lead Implant Date: 20161003
Implantable Lead Location: 753859
Implantable Lead Location: 753860
Implantable Lead Model: 377
Implantable Lead Model: 377
Implantable Lead Serial Number: 49295355
Implantable Lead Serial Number: 49335002
Implantable Pulse Generator Implant Date: 20161003
Pulse Gen Model: 394929
Pulse Gen Serial Number: 68595075

## 2023-12-01 ENCOUNTER — Ambulatory Visit: Payer: Self-pay | Admitting: Cardiology

## 2024-01-14 HISTORY — PX: OTHER SURGICAL HISTORY: SHX169

## 2024-01-22 ENCOUNTER — Ambulatory Visit: Payer: Self-pay | Admitting: Family

## 2024-01-26 ENCOUNTER — Ambulatory Visit: Admitting: Family

## 2024-01-26 ENCOUNTER — Encounter: Payer: Self-pay | Admitting: Family

## 2024-01-26 VITALS — BP 124/72 | HR 67 | Temp 97.8°F | Resp 18 | Ht 65.0 in | Wt 122.0 lb

## 2024-01-26 DIAGNOSIS — E785 Hyperlipidemia, unspecified: Secondary | ICD-10-CM

## 2024-01-26 DIAGNOSIS — D563 Thalassemia minor: Secondary | ICD-10-CM

## 2024-01-26 DIAGNOSIS — K5901 Slow transit constipation: Secondary | ICD-10-CM

## 2024-01-26 DIAGNOSIS — G609 Hereditary and idiopathic neuropathy, unspecified: Secondary | ICD-10-CM

## 2024-01-26 DIAGNOSIS — N1832 Chronic kidney disease, stage 3b: Secondary | ICD-10-CM | POA: Diagnosis not present

## 2024-01-26 DIAGNOSIS — E039 Hypothyroidism, unspecified: Secondary | ICD-10-CM | POA: Diagnosis not present

## 2024-01-26 DIAGNOSIS — I10 Essential (primary) hypertension: Secondary | ICD-10-CM | POA: Diagnosis not present

## 2024-01-26 NOTE — Patient Instructions (Signed)
 1.Report to local pharmacy to receive Shingles Vaccine.

## 2024-01-26 NOTE — Progress Notes (Signed)
 "   Provider: [name] FNP-C   Errick Salts, Roxan BROCKS, NP  Patient Care Team: Chloie Loney, Roxan BROCKS, NP as PCP - General (Family Medicine) Cindie Ole DASEN, MD (Inactive) as PCP - Electrophysiology (Cardiology)  Extended Emergency Contact Information Primary Emergency Contact: Teresa Elsie Marsh Address: [address] [place], [place] [address] [place] Home Phone: [phone] Mobile Phone: [phone] Relation: Son Secondary Emergency Contact: Hayes,Josie Address: [address]          [place], [place] [address] [place] Phone: [phone] Relation: Sister Preferred language: English Interpreter needed? No  Code Status:  Full Code  Goals of care: Advanced Directive information    01/26/2024    3:30 PM  Advanced Directives  Does Patient Have a Medical Advance Directive? Yes  Type of Advance Directive Living will;Healthcare Power of Attorney  Does patient want to make changes to medical advance directive? No - Patient declined  Copy of Healthcare Power of Attorney in Chart? No - copy requested     Chief Complaint  Patient presents with   Medical Management of Chronic Issues    6 Month follow up.    History of Present Illness   Kristine Sherman is a 89 year old female who presents for a six-month follow-up appointment.Here with son who is the primary care giver.   She experiences intermittent pain in her shin and hip. The shin pain is located in the front, below the knee, and sometimes radiates up and down. She uses ointments like lidocaine or Biofreeze and takes Tylenol  for relief. There is no history of injury to her legs.  She has neuropathy with numbness and tingling in her hands and feet, with the feet being more affected. The symptoms have increased, and she previously took gabapentin  at bedtime for this condition. Her feet and hands remain cold.  Her current medications include Tylenol  for pain, baby aspirin ,  atorvastatin  40 mg for cholesterol, vitamin B12, Restasis, Cosopt , latanoprost , betadine, and Systane as needed. She recently had laser eye surgery on her left eye.  No dizziness, constipation, urinary issues, or depression. Her appetite is good, and she drinks about two 32-ounce bottles of water daily. She includes vegetables in her diet and has no issues with constipation anymore.    Past Medical History:  Diagnosis Date   CKD (chronic kidney disease)    Glaucoma    Hyperlipidemia    Hypertension    Hypothyroidism    Neuropathy    Pacemaker    Seasonal allergies    Vertigo    Vitamin B12 deficiency    Past Surgical History:  Procedure Laterality Date   ABDOMINAL HYSTERECTOMY     DG GALL BLADDER     eye sugery Left 01/2024    Allergies[1]  Allergies as of 01/26/2024       Reactions   Oxycodone-aspirin  Other (See Comments)   Neurologic reaction - caused seizures, passed out (reaction to Percodan)   Sulfa Antibiotics Hives, Itching, Rash        Medication List        Accurate as of January 26, 2024  4:36 PM. If you have any questions, ask your nurse or doctor.          acetaminophen  325 MG tablet Commonly known as: TYLENOL  Take 650 mg by mouth every 6 (six) hours as needed for headache.   aspirin  EC 81 MG tablet Take 81 mg by  mouth at bedtime. Swallow whole.   atorvastatin  40 MG tablet Commonly known as: LIPITOR Take 1 tablet (40 mg total) by mouth every morning.   brimonidine  0.2 % ophthalmic solution Commonly known as: ALPHAGAN  Place 1 drop into both eyes 3 (three) times daily.   dorzolamide -timolol  2-0.5 % ophthalmic solution Commonly known as: COSOPT  Place 1 drop into both eyes 2 (two) times daily.   levothyroxine  50 MCG tablet Commonly known as: SYNTHROID  Take 1 tablet (50 mcg total) by mouth daily.   meclizine  12.5 MG tablet Commonly known as: ANTIVERT  Take 1 tablet (12.5 mg total) by mouth 3 (three) times daily as needed for dizziness.    PATADAY OP Place into both eyes as needed.   Restasis 0.05 % ophthalmic emulsion Generic drug: cycloSPORINE Place 1 drop into both eyes 2 (two) times daily.   Rocklatan  0.02-0.005 % Soln Generic drug: Netarsudil -Latanoprost  Place 1 drop into both eyes at bedtime.   SYSTANE PRESERVATIVE FREE OP Place 1 drop into both eyes 4 (four) times daily as needed (dry eyes). Systane Complete Preservative Free   VITAMIN B-12 SL Place 500 mcg under the tongue every morning. 1 spray - 500 mcg        Review of Systems  Constitutional:  Negative for appetite change, chills, fatigue, fever and unexpected weight change.  HENT:  Negative for congestion, dental problem, ear discharge, ear pain, hearing loss, nosebleeds, postnasal drip, rhinorrhea, sinus pressure, sinus pain, sneezing, sore throat, tinnitus and trouble swallowing.   Eyes:  Negative for pain, discharge, redness, itching and visual disturbance.  Respiratory:  Negative for cough, chest tightness, shortness of breath and wheezing.   Cardiovascular:  Negative for chest pain, palpitations and leg swelling.  Gastrointestinal:  Negative for abdominal distention, abdominal pain, blood in stool, constipation, diarrhea, nausea and vomiting.  Endocrine: Negative for cold intolerance, heat intolerance, polydipsia, polyphagia and polyuria.  Genitourinary:  Negative for difficulty urinating, dysuria, flank pain, frequency and urgency.  Musculoskeletal:  Positive for arthralgias and gait problem. Negative for back pain, joint swelling, myalgias, neck pain and neck stiffness.       Right leg pain   Skin:  Negative for color change, pallor, rash and wound.  Neurological:  Negative for dizziness, syncope, speech difficulty, weakness, light-headedness, numbness and headaches.  Hematological:  Does not bruise/bleed easily.  Psychiatric/Behavioral:  Negative for agitation, behavioral problems, confusion, hallucinations and sleep disturbance. The patient is  not nervous/anxious.     Immunization History  Administered Date(s) Administered   Fluad Quad(high Dose 65+) 10/18/2021   Fluad Trivalent(High Dose 65+) 09/18/2022   INFLUENZA, HIGH DOSE SEASONAL PF 09/11/2020   Influenza Split 10/19/2016, 10/27/2016, 10/22/2017, 09/29/2018   Influenza-Unspecified 10/27/2016, 09/26/2023   Moderna Covid-19 Vaccine Bivalent Booster 41yrs & up 10/16/2022   PFIZER(Purple Top)SARS-COV-2 Vaccination 01/19/2019, 02/07/2019, 10/11/2019   Pfizer(Comirnaty)Fall Seasonal Vaccine 12 years and older 11/18/2021   Pneumococcal Conjugate-13 10/22/2016   Pneumococcal Polysaccharide-23 02/01/2018   Tdap 07/29/2017, 09/26/2023   Unspecified SARS-COV-2 Vaccination 09/26/2023   Zoster Recombinant(Shingrix) 01/20/2017   Pertinent  Health Maintenance Due  Topic Date Due   Influenza Vaccine  Completed   Bone Density Scan  Discontinued      09/18/2022    2:33 PM 01/02/2023    1:20 PM 07/22/2023    9:02 AM 08/21/2023    1:49 PM 01/26/2024    3:30 PM  Fall Risk  Falls in the past year? 0 0 0 0 0  Was there an injury with Fall? 0  0  0  0  0  Fall Risk Category Calculator 0 0 0 0 0  Patient at Risk for Falls Due to No Fall Risks  No Fall Risks No Fall Risks No Fall Risks  Fall risk Follow up Falls evaluation completed;Education provided;Falls prevention discussed  Falls evaluation completed Falls evaluation completed Falls evaluation completed     Data saved with a previous flowsheet row definition   Functional Status Survey:    Vitals:   01/26/24 1532  BP: 124/72  Pulse: 67  Resp: 18  Temp: 97.8 F (36.6 C)  SpO2: 97%  Weight: 122 lb (55.3 kg)  Height: 5' 5 (1.651 m)   Body mass index is 20.3 kg/m. Physical Exam   VITALS: T- 97.8, P- 67, BP- 124/72, SaO2- 99% MEASUREMENTS: Weight- 122. GENERAL: Alert, cooperative, well developed, no acute distress. HEENT: Normocephalic, normal oropharynx, moist mucous membranes, ears and nose normal, no sinus  tenderness. NECK: Supple, no abnormalities. CHEST: Clear to auscultation bilaterally, no wheezes, rhonchi, or crackles. CARDIOVASCULAR: Normal heart rate and rhythm, S1 and S2 normal without murmurs. ABDOMEN: Soft, non-tender, non-distended, without organomegaly, normal bowel sounds. EXTREMITIES: No cyanosis, edema, redness, or swelling. NEUROLOGICAL: Cranial nerves grossly intact, moves all extremities without gross motor or sensory deficit. SKIN: Intact, no rashes or lesions or erythema   PSYCHIATRY/BEHAVIORAL: Mood stable   Labs reviewed: Recent Labs    07/12/23 2335 07/22/23 0931 08/13/23 1046  NA 128* 134* 132*  K 4.4 4.1 4.1  CL 97* 101 104  CO2 23 27 19*  GLUCOSE 81 88 89  BUN 20 15 15   CREATININE 0.94 1.15* 1.10*  CALCIUM  8.3* 8.8 7.7*   Recent Labs    07/22/23 0931 08/13/23 1046  AST 19 24  ALT 12 17  ALKPHOS  --  79  BILITOT 0.6 0.8  PROT 6.4 5.6*  ALBUMIN  --  3.0*   Recent Labs    07/12/23 2335 08/13/23 1046  WBC 5.6 4.4  NEUTROABS  --  2.6  HGB 10.6* 8.7*  HCT 35.5* 29.4*  MCV 74.0* 73.0*  PLT 118* 104*   Lab Results  Component Value Date   TSH 4.43 07/22/2023   No results found for: HGBA1C Lab Results  Component Value Date   CHOL 125 07/22/2023   HDL 57 07/22/2023   LDLCALC 51 07/22/2023   TRIG 89 07/22/2023   CHOLHDL 2.2 07/22/2023    Significant Diagnostic Results in last 30 days:  No results found.  Assessment/Plan  Idiopathic peripheral neuropathy Chronic peripheral neuropathy with increased numbness and tingling in hands and feet, worse in feet. Cold sensation in hands and feet likely related to neuropathy and hypothyroidism. Previously on gabapentin .  - Consider gabapentin  as needed for neuropathy symptoms.  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 Anemia may be exacerbated by chronic kidney disease and alpha thalassemia trait. - Ordered lab work to recheck anemia  levels.  Acquired hypothyroidism Managed with levothyroxine  50 mcg daily. Cold intolerance likely related to hypothyroidism. - Continue levothyroxine  50 mcg daily on an empty stomach.  Essential hypertension Blood pressure well-controlled at 124/72 mmHg. No recent dizziness or other symptoms reported. - Encouraged regular home blood pressure monitoring.  Hyperlipidemia Managed with atorvastatin  40 mg daily. - Continue atorvastatin  40 mg daily.  Alpha thalassemia trait Contributing to chronic anemia. - Ordered lab work to recheck anemia levels.  General health maintenance Routine health maintenance discussed. Recent laser eye surgery for left eye. No  recent issues with constipation. Adequate hydration and diet with vegetables. - Encouraged continued hydration and balanced diet.    Family/ staff Communication: Reviewed plan of care with patient and son verbalized understanding  Labs/tests ordered: - CBC with Differential/Platelet - CMP with eGFR([facility]) - TSH - Lipid panel  Next Appointment : Return in about 6 months (around 07/25/2024) for medical mangement of chronic issues.SABRA   Spent 30 minutes of Face to face and non-face to face with patient  >50% time spent counseling; reviewing medical record; tests; labs; documentation and developing future plan of care.   Roxan BROCKS Javyn Havlin, NP      [1]  Allergies Allergen Reactions   Oxycodone-Aspirin  Other (See Comments)    Neurologic reaction - caused seizures, passed out (reaction to Percodan)     Sulfa Antibiotics Hives, Itching and Rash   "

## 2024-01-27 LAB — COMPREHENSIVE METABOLIC PANEL WITH GFR
AG Ratio: 1.9 (calc) (ref 1.0–2.5)
ALT: 16 U/L (ref 6–29)
AST: 18 U/L (ref 10–35)
Albumin: 4.1 g/dL (ref 3.6–5.1)
Alkaline phosphatase (APISO): 138 U/L (ref 37–153)
BUN/Creatinine Ratio: 15 (calc) (ref 6–22)
BUN: 16 mg/dL (ref 7–25)
CO2: 25 mmol/L (ref 20–32)
Calcium: 8.6 mg/dL (ref 8.6–10.4)
Chloride: 103 mmol/L (ref 98–110)
Creat: 1.07 mg/dL — ABNORMAL HIGH (ref 0.60–0.95)
Globulin: 2.2 g/dL (ref 1.9–3.7)
Glucose, Bld: 91 mg/dL (ref 65–139)
Potassium: 4.2 mmol/L (ref 3.5–5.3)
Sodium: 135 mmol/L (ref 135–146)
Total Bilirubin: 0.8 mg/dL (ref 0.2–1.2)
Total Protein: 6.3 g/dL (ref 6.1–8.1)
eGFR: 48 mL/min/1.73m2 — ABNORMAL LOW

## 2024-01-27 LAB — LIPID PANEL
Cholesterol: 115 mg/dL
HDL: 49 mg/dL — ABNORMAL LOW
LDL Cholesterol (Calc): 48 mg/dL
Non-HDL Cholesterol (Calc): 66 mg/dL
Total CHOL/HDL Ratio: 2.3 (calc)
Triglycerides: 94 mg/dL

## 2024-01-27 LAB — CBC WITH DIFFERENTIAL/PLATELET
Absolute Lymphocytes: 1863 {cells}/uL (ref 850–3900)
Absolute Monocytes: 432 {cells}/uL (ref 200–950)
Basophils Absolute: 22 {cells}/uL (ref 0–200)
Basophils Relative: 0.4 %
Eosinophils Absolute: 81 {cells}/uL (ref 15–500)
Eosinophils Relative: 1.5 %
HCT: 33.9 % — ABNORMAL LOW (ref 35.9–46.0)
Hemoglobin: 10.1 g/dL — ABNORMAL LOW (ref 11.7–15.5)
MCH: 21.8 pg — ABNORMAL LOW (ref 27.0–33.0)
MCHC: 29.8 g/dL — ABNORMAL LOW (ref 31.6–35.4)
MCV: 73.1 fL — ABNORMAL LOW (ref 81.4–101.7)
Monocytes Relative: 8 %
Neutro Abs: 3002 {cells}/uL (ref 1500–7800)
Neutrophils Relative %: 55.6 %
Platelets: 209 Thousand/uL (ref 140–400)
RBC: 4.64 Million/uL (ref 3.80–5.10)
RDW: 16 % — ABNORMAL HIGH (ref 11.0–15.0)
Total Lymphocyte: 34.5 %
WBC: 5.4 Thousand/uL (ref 3.8–10.8)

## 2024-01-27 LAB — TSH: TSH: 1.16 m[IU]/L (ref 0.40–4.50)

## 2024-01-29 ENCOUNTER — Ambulatory Visit: Payer: Self-pay | Admitting: Family

## 2024-01-29 NOTE — Telephone Encounter (Signed)
 Patient has been notified and voiced understanding. Letter mailed per patient request.

## 2024-02-16 ENCOUNTER — Ambulatory Visit

## 2024-02-17 LAB — CUP PACEART REMOTE DEVICE CHECK
Battery Voltage: 35
Date Time Interrogation Session: 20260203093734
Implantable Lead Connection Status: 753985
Implantable Lead Connection Status: 753985
Implantable Lead Implant Date: 20161003
Implantable Lead Implant Date: 20161003
Implantable Lead Location: 753859
Implantable Lead Location: 753860
Implantable Lead Model: 377
Implantable Lead Model: 377
Implantable Lead Serial Number: 49295355
Implantable Lead Serial Number: 49335002
Implantable Pulse Generator Implant Date: 20161003
Pulse Gen Model: 394929
Pulse Gen Serial Number: 68595075

## 2024-05-17 ENCOUNTER — Ambulatory Visit

## 2024-07-26 ENCOUNTER — Ambulatory Visit: Admitting: Family

## 2024-08-16 ENCOUNTER — Ambulatory Visit

## 2024-08-25 ENCOUNTER — Ambulatory Visit: Payer: Self-pay | Admitting: Family
